# Patient Record
Sex: Male | Born: 1985 | Race: White | Hispanic: No | Marital: Married | Smoking: Never smoker
Health system: Southern US, Community
[De-identification: ages and names within clinical notes are randomized; demographics above are authoritative.]

## PROBLEM LIST (undated history)

## (undated) DIAGNOSIS — R0683 Snoring: Secondary | ICD-10-CM

## (undated) DIAGNOSIS — R519 Headache, unspecified: Secondary | ICD-10-CM

## (undated) DIAGNOSIS — R51 Headache: Secondary | ICD-10-CM

## (undated) DIAGNOSIS — G43909 Migraine, unspecified, not intractable, without status migrainosus: Secondary | ICD-10-CM

## (undated) HISTORY — DX: Snoring: R06.83

## (undated) HISTORY — PX: WISDOM TOOTH EXTRACTION: SHX21

---

## 2013-02-05 ENCOUNTER — Encounter (HOSPITAL_COMMUNITY): Payer: Self-pay | Admitting: Emergency Medicine

## 2013-02-05 ENCOUNTER — Emergency Department (HOSPITAL_COMMUNITY)
Admission: EM | Admit: 2013-02-05 | Discharge: 2013-02-05 | Disposition: A | Discharge status: Home / Self Care | Payer: BC Managed Care – PPO | Source: Home / Self Care | Attending: Family Medicine | Admitting: Family Medicine

## 2013-02-05 DIAGNOSIS — J039 Acute tonsillitis, unspecified: Secondary | ICD-10-CM

## 2013-02-05 LAB — POCT RAPID STREP A: Streptococcus, Group A Screen (Direct): NEGATIVE

## 2013-02-05 MED ORDER — AMOXICILLIN-POT CLAVULANATE 875-125 MG PO TABS: 1.0000 | ORAL_TABLET | Freq: Two times a day (BID) | ORAL | Status: DC

## 2013-02-05 NOTE — ED Notes (Signed)
C/o sore throat  States he does have some diarrhea, nasal drainage and fatigue States there is a new born in home States this morning he saw pus in his throat

## 2013-02-05 NOTE — Discharge Instructions (Signed)
Thank you for coming in today. Take Augmentin twice daily for one week. Use up to 2 Aleve twice daily for pain control. Call or go to the emergency room if you get worse, have trouble breathing, have chest pains, or palpitations.

## 2013-02-05 NOTE — ED Provider Notes (Signed)
Joel Henry is a 28 y.o. male who presents to Urgent Care today for sore throat for 3 days. Patient has tonsillar exudate. His symptoms are consistent with prior episodes of tonsillitis. He has not tried any medications. He denies any fevers chills nausea vomiting but does note some mild diarrhea. He denies any cough but does note some nasal congestion. He has a 15-day-old newborn at home.   History reviewed. No pertinent past medical history. History  Substance Use Topics  . Smoking status: Not on file  . Smokeless tobacco: Not on file  . Alcohol Use: Not on file   ROS as above Medications: No current facility-administered medications for this encounter.   Current Outpatient Prescriptions  Medication Sig Dispense Refill  . amoxicillin-clavulanate (AUGMENTIN) 875-125 MG per tablet Take 1 tablet by mouth every 12 (twelve) hours.  14 tablet  0    Exam:  BP 135/72  Pulse 68  Temp(Src) 98.7 F (37.1 C) (Oral)  Resp 14  SpO2 100% Gen: Well NAD HEENT: EOMI,  MMM right tonsillar erythematous with exudate.  Today membranes are normal appearing bilaterally. Right anterior cervical lymphadenopathy is present. Lungs: Normal work of breathing. CTABL Heart: RRR no MRG Abd: NABS, Soft. NT, ND Exts: Brisk capillary refill, warm and well perfused.   Results for orders placed during the hospital encounter of 02/05/13 (from the past 24 hour(s))  POCT RAPID STREP A ([facility] CARE ONLY)     Status: None   Collection Time    02/05/13  9:27 AM      Result Value Range   Streptococcus, Group A Screen (Direct) NEGATIVE  NEGATIVE   No results found.  Assessment and Plan: 28 y.o. male with tonsillitis. Strep culture is pending. Plan to treat with Augmentin and use over-the-counter Aleve for pain control.  Discussed warning signs or symptoms. Please see discharge instructions. Patient expresses understanding.    Rodolph BongEvan S Kindell Strada, MD 02/05/13 [phone]

## 2013-02-07 LAB — CULTURE, GROUP A STREP

## 2013-07-30 ENCOUNTER — Encounter: Payer: Self-pay | Admitting: *Deleted

## 2013-07-30 ENCOUNTER — Encounter: Payer: Self-pay | Admitting: Podiatry

## 2013-07-30 ENCOUNTER — Ambulatory Visit (INDEPENDENT_AMBULATORY_CARE_PROVIDER_SITE_OTHER): Payer: BC Managed Care – PPO | Admitting: Podiatry

## 2013-07-30 VITALS — BP 130/76 | HR 73 | Resp 16 | Ht 71.0 in | Wt 200.0 lb

## 2013-07-30 DIAGNOSIS — L608 Other nail disorders: Secondary | ICD-10-CM

## 2013-07-30 NOTE — Patient Instructions (Signed)

## 2013-07-30 NOTE — Progress Notes (Signed)
Nail specimens of the 1st left toenail sent out to [facility].

## 2013-07-30 NOTE — Progress Notes (Signed)
   Subjective:    Patient ID: Joel Henry, male    DOB: 1985-09-23, 28 y.o.   MRN: [id number]  HPI Comments: "I have this toenail problem"  Patient c/o thick, discolored toenail, 1st toe left foot, for about 1 year. [place] and had trouble with ingrowns. They removed the whole toenail at the time. The nail grew back and has been discolored and thick since. No at home treatment.     Review of Systems  All other systems reviewed and are negative.      Objective:   Physical Exam: I have reviewed his past medical history medications allergies surgeries social history and review of systems. Pulses are strongly palpable bilateral. Neurologic sensorium is intact per Semmes-Weinstein monofilament. Deep tendon reflexes are intact bilateral muscle strength is 5 over 5 dorsiflexors plantar flexors inverters everters all intrinsic musculature is intact. Orthopedic evaluation demonstrates all joints distal to the ankle a full range of motion without crepitation. Cutaneous evaluation demonstrates supple well hydrated cutis without erythema edema saline is drainage or odor. Hallux nail plate left demonstrates distal onycholysis with questionable onychomycosis. More than likely this is a nail dystrophy left hallux. I see no signs of fungal infection about the skin.        Assessment & Plan:  Assessment: Nail dystrophy hallux toenail plate left.  Plan: Discussed etiology pathology conservative versus surgical therapies. At this point a sample of the nail and skin were taken for pathologic evaluation. We will notify him once we have the results.

## 2013-11-06 ENCOUNTER — Encounter (HOSPITAL_COMMUNITY): Payer: Self-pay | Admitting: Emergency Medicine

## 2013-11-06 ENCOUNTER — Emergency Department (INDEPENDENT_AMBULATORY_CARE_PROVIDER_SITE_OTHER)
Admission: EM | Admit: 2013-11-06 | Discharge: 2013-11-06 | Disposition: A | Discharge status: Home / Self Care | Payer: BC Managed Care – PPO | Source: Home / Self Care | Attending: Family Medicine | Admitting: Family Medicine

## 2013-11-06 DIAGNOSIS — L237 Allergic contact dermatitis due to plants, except food: Secondary | ICD-10-CM

## 2013-11-06 MED ORDER — TRIAMCINOLONE ACETONIDE 40 MG/ML IJ SUSP
INTRAMUSCULAR | Status: AC
  Filled 2013-11-06: qty 1

## 2013-11-06 MED ORDER — METHYLPREDNISOLONE ACETATE 80 MG/ML IJ SUSP
INTRAMUSCULAR | Status: AC
  Filled 2013-11-06: qty 1

## 2013-11-06 MED ORDER — TRIAMCINOLONE ACETONIDE 40 MG/ML IJ SUSP
40.0000 mg | Freq: Once | INTRAMUSCULAR | Status: AC
  Administered 2013-11-06: 40 mg via INTRAMUSCULAR

## 2013-11-06 MED ORDER — FLUTICASONE PROPIONATE 0.05 % EX CREA: TOPICAL_CREAM | Freq: Two times a day (BID) | CUTANEOUS | Status: DC

## 2013-11-06 MED ORDER — METHYLPREDNISOLONE ACETATE 40 MG/ML IJ SUSP
80.0000 mg | Freq: Once | INTRAMUSCULAR | Status: AC
  Administered 2013-11-06: 80 mg via INTRAMUSCULAR

## 2013-11-06 NOTE — ED Notes (Signed)
Rash since yesterday, ?poison ivy ??

## 2013-11-06 NOTE — ED Provider Notes (Signed)
CSN: [id number]     Arrival date & time 11/06/13  0847 History   First MD Initiated Contact with Patient 11/06/13 [phone]     No chief complaint on file.  (Consider location/radiation/quality/duration/timing/severity/associated sxs/prior Treatment) Patient is a 28 y.o. male presenting with rash. The history is provided by the patient.  Rash Location:  Head/neck and shoulder/arm Head/neck rash location:  R neck Shoulder/arm rash location:  R forearm Quality: blistering, draining, itchiness and redness   Severity:  Mild Onset quality:  Gradual Duration:  1 day Progression:  Spreading Chronicity:  New Context: plant contact     No past medical history on file. No past surgical history on file. No family history on file. History  Substance Use Topics  . Smoking status: Never Smoker   . Smokeless tobacco: Not on file  . Alcohol Use: Not on file    Review of Systems  Skin: Positive for rash.    Allergies  Review of patient's allergies indicates no known allergies.  Home Medications   Prior to Admission medications   Medication Sig Start Date End Date Taking? Authorizing Provider  fluticasone (CUTIVATE) 0.05 % cream Apply topically 2 (two) times daily. 11/06/13   Linna HoffJames D Kindl, MD  OVER THE COUNTER MEDICATION Allergy meds    Historical Provider, MD   BP 140/75  Pulse 78  Temp(Src) 97.9 F (36.6 C) (Oral)  Resp 16  SpO2 98% Physical Exam  Nursing note and vitals reviewed. Constitutional: He is oriented to person, place, and time. He appears well-developed and well-nourished.  Neurological: He is alert and oriented to person, place, and time.  Skin: Skin is warm and dry. Rash noted.  Weeping papulovesicular patchy rash on neck and r forearm.    ED Course  Procedures (including critical care time) Labs Review Labs Reviewed - No data to display  Imaging Review No results found.   MDM   1. Contact dermatitis due to poison ivy        Linna HoffJames D Kindl,  MD 11/06/13 [phone]

## 2013-12-13 ENCOUNTER — Encounter (HOSPITAL_COMMUNITY): Payer: Self-pay | Admitting: Emergency Medicine

## 2013-12-13 ENCOUNTER — Emergency Department (INDEPENDENT_AMBULATORY_CARE_PROVIDER_SITE_OTHER)
Admission: EM | Admit: 2013-12-13 | Discharge: 2013-12-13 | Disposition: A | Discharge status: Home / Self Care | Payer: BC Managed Care – PPO | Source: Home / Self Care | Attending: Family Medicine | Admitting: Family Medicine

## 2013-12-13 DIAGNOSIS — R51 Headache: Secondary | ICD-10-CM

## 2013-12-13 DIAGNOSIS — R519 Headache, unspecified: Secondary | ICD-10-CM

## 2013-12-13 DIAGNOSIS — R251 Tremor, unspecified: Secondary | ICD-10-CM

## 2013-12-13 LAB — POCT I-STAT, CHEM 8
BUN: 12 mg/dL (ref 6–23)
CREATININE: 0.9 mg/dL (ref 0.50–1.35)
Calcium, Ion: 1.22 mmol/L (ref 1.12–1.23)
Chloride: 102 mEq/L (ref 96–112)
Glucose, Bld: 96 mg/dL (ref 70–99)
HCT: 52 % (ref 39.0–52.0)
Hemoglobin: 17.7 g/dL — ABNORMAL HIGH (ref 13.0–17.0)
POTASSIUM: 3.9 meq/L (ref 3.7–5.3)
SODIUM: 140 meq/L (ref 137–147)
TCO2: 27 mmol/L (ref 0–100)

## 2013-12-13 MED ORDER — KETOROLAC TROMETHAMINE 60 MG/2ML IM SOLN
INTRAMUSCULAR | Status: AC
  Filled 2013-12-13: qty 2

## 2013-12-13 MED ORDER — METOCLOPRAMIDE HCL 5 MG/ML IJ SOLN
5.0000 mg | Freq: Once | INTRAMUSCULAR | Status: AC
  Administered 2013-12-13: 5 mg via INTRAMUSCULAR

## 2013-12-13 MED ORDER — DEXAMETHASONE SODIUM PHOSPHATE 10 MG/ML IJ SOLN
10.0000 mg | Freq: Once | INTRAMUSCULAR | Status: AC
  Administered 2013-12-13: 10 mg via INTRAMUSCULAR

## 2013-12-13 MED ORDER — METOCLOPRAMIDE HCL 5 MG/ML IJ SOLN
INTRAMUSCULAR | Status: AC
  Filled 2013-12-13: qty 2

## 2013-12-13 MED ORDER — DEXAMETHASONE SODIUM PHOSPHATE 10 MG/ML IJ SOLN
INTRAMUSCULAR | Status: AC
  Filled 2013-12-13: qty 1

## 2013-12-13 MED ORDER — KETOROLAC TROMETHAMINE 60 MG/2ML IM SOLN
60.0000 mg | Freq: Once | INTRAMUSCULAR | Status: AC
  Administered 2013-12-13: 60 mg via INTRAMUSCULAR

## 2013-12-13 MED ORDER — DICLOFENAC SODIUM 75 MG PO TBEC: 75.0000 mg | DELAYED_RELEASE_TABLET | Freq: Two times a day (BID) | ORAL | Status: DC | PRN

## 2013-12-13 NOTE — Discharge Instructions (Signed)
Thank you for coming in today. Take diclofenac twice daily as needed for pain starting about 6 PM today. Follow-up with your primary care provider soon. Go to the emergency room if you get worse  Go to the emergency room if your headache becomes excruciating or you have weakness or numbness or uncontrolled vomiting.   Migraine Headache A migraine headache is an intense, throbbing pain on one or both sides of your head. A migraine can last for 30 minutes to several hours. CAUSES  The exact cause of a migraine headache is not always known. However, a migraine may be caused when nerves in the brain become irritated and release chemicals that cause inflammation. This causes pain. Certain things may also trigger migraines, such as:  Alcohol.  Smoking.  Stress.  Menstruation.  Aged cheeses.  Foods or drinks that contain nitrates, glutamate, aspartame, or tyramine.  Lack of sleep.  Chocolate.  Caffeine.  Hunger.  Physical exertion.  Fatigue.  Medicines used to treat chest pain (nitroglycerine), birth control pills, estrogen, and some blood pressure medicines. SIGNS AND SYMPTOMS  Pain on one or both sides of your head.  Pulsating or throbbing pain.  Severe pain that prevents daily activities.  Pain that is aggravated by any physical activity.  Nausea, vomiting, or both.  Dizziness.  Pain with exposure to bright lights, loud noises, or activity.  General sensitivity to bright lights, loud noises, or smells. Before you get a migraine, you may get warning signs that a migraine is coming (aura). An aura may include:  Seeing flashing lights.  Seeing bright spots, halos, or zigzag lines.  Having tunnel vision or blurred vision.  Having feelings of numbness or tingling.  Having trouble talking.  Having muscle weakness. DIAGNOSIS  A migraine headache is often diagnosed based on:  Symptoms.  Physical exam.  A CT scan or MRI of your head. These imaging tests  cannot diagnose migraines, but they can help rule out other causes of headaches. TREATMENT Medicines may be given for pain and nausea. Medicines can also be given to help prevent recurrent migraines.  HOME CARE INSTRUCTIONS  Only take over-the-counter or prescription medicines for pain or discomfort as directed by your health care provider. The use of long-term narcotics is not recommended.  Lie down in a dark, quiet room when you have a migraine.  Keep a journal to find out what may trigger your migraine headaches. For example, write down:  What you eat and drink.  How much sleep you get.  Any change to your diet or medicines.  Limit alcohol consumption.  Quit smoking if you smoke.  Get 7-9 hours of sleep, or as recommended by your health care provider.  Limit stress.  Keep lights dim if bright lights bother you and make your migraines worse. SEEK IMMEDIATE MEDICAL CARE IF:   Your migraine becomes severe.  You have a fever.  You have a stiff neck.  You have vision loss.  You have muscular weakness or loss of muscle control.  You start losing your balance or have trouble walking.  You feel faint or pass out.  You have severe symptoms that are different from your first symptoms. MAKE SURE YOU:   Understand these instructions.  Will watch your condition.  Will get help right away if you are not doing well or get worse. Document Released: 01/08/2005 Document Revised: 05/25/2013 Document Reviewed: 09/15/2012 [facility] Patient Information 2015 [place] [place]. This information is not intended to replace advice given to you by  your health care provider. Make sure you discuss any questions you have with your health care provider. ° °

## 2013-12-13 NOTE — ED Provider Notes (Addendum)
Joel Henry is a 28 y.o. male who presents to Urgent Care today for headache. Patient is at a moderate persistent headache for about a week. This is associated with some fatigue. He also notes mild nausea. He denies any significant vision changes. No difficulty with balance. He notes a mild tremor in his left hand starting about a day ago. He is left-handed. He feels well otherwise. The headache was intermittent initially but has become persistent. The headache is gradual onset. No history of migraines. He has tried over-the-counter medications which helped only a little.   History reviewed. No pertinent past medical history. History reviewed. No pertinent past surgical history. History  Substance Use Topics  . Smoking status: Never Smoker   . Smokeless tobacco: Not on file  . Alcohol Use: No   ROS as above Medications: No current facility-administered medications for this encounter.   Current Outpatient Prescriptions  Medication Sig Dispense Refill  . diclofenac (VOLTAREN) 75 MG EC tablet Take 1 tablet (75 mg total) by mouth 2 (two) times daily as needed. 30 tablet 0  . fluticasone (CUTIVATE) 0.05 % cream Apply topically 2 (two) times daily. 60 g 0  . OVER THE COUNTER MEDICATION Allergy meds     No Known Allergies   Exam:  BP 138/86 mmHg  Pulse 77  Temp(Src) 97.3 F (36.3 C) (Oral)  Resp 16  SpO2 97% Gen: Well NAD HEENT: EOMI,  MMM PERRL Lungs: Normal work of breathing. CTABL Heart: RRR no MRG Abd: NABS, Soft. Nondistended, Nontender Exts: Brisk capillary refill, warm and well perfused.  Neuro: Alert and oriented cranial nerves II through XII are intact normal coordination balance sensation strength. Finger-nose-finger reveals no ataxia. Patient has a very faint fine tremor of his left index finger. The tremor is not present at rest. His neuro exam is otherwise normal.  Visual Acuity:  Right Eye Distance: 20/15   Left Eye Distance: 20/15 Bilateral  Distance: 20/15   Patient was given intramuscular injections as listed above.  Results for orders placed or performed during the hospital encounter of 12/13/13 (from the past 24 hour(s))  I-STAT, chem 8     Status: Abnormal   Collection Time: 12/13/13 11:51 AM  Result Value Ref Range   Sodium 140 137 - 147 mEq/L   Potassium 3.9 3.7 - 5.3 mEq/L   Chloride 102 96 - 112 mEq/L   BUN 12 6 - 23 mg/dL   Creatinine, Ser 3.080.90 0.50 - 1.35 mg/dL   Glucose, Bld 96 70 - 99 mg/dL   Calcium, Ion 6.571.22 8.461.12 - 1.23 mmol/L   TCO2 27 0 - 100 mmol/L   Hemoglobin 17.7 (H) 13.0 - 17.0 g/dL   HCT 96.252.0 95.239.0 - 84.152.0 %   No results found.  Assessment and Plan: 28 y.o. male with headache. Likely migraine. Treatment with medications as above. Continue NSAIDs. Follow-up with PCP. Present to the emergency room if worse. Tremor I believe is related to headache. Recommend patient follow-up with PCP.  Discussed warning signs or symptoms. Please see discharge instructions. Patient expresses understanding.     Rodolph BongEvan S Shivam Mestas, MD 12/13/13 1205   ketorolac (TORADOL) IM injection 60 mg ; metoCLOPramide (REGLAN) IM injection 5 mg ; dexamethasone IM (DECADRON) injection 10 mg   given  Rodolph BongEvan S Thoren Hosang, MD 12/19/13 [phone]

## 2013-12-13 NOTE — ED Notes (Signed)
C/o headache x 1 wk.   States the last 2 to 3 days headache has been constant and having no relief with tylenol.  States fuzzy vision with standing and having mild nausea.  No hx of migraines.

## 2013-12-15 ENCOUNTER — Emergency Department (HOSPITAL_COMMUNITY): Payer: BC Managed Care – PPO

## 2013-12-15 ENCOUNTER — Encounter (HOSPITAL_COMMUNITY): Payer: Self-pay | Admitting: Emergency Medicine

## 2013-12-15 ENCOUNTER — Observation Stay (HOSPITAL_COMMUNITY)
Admission: EM | Admit: 2013-12-15 | Discharge: 2013-12-16 | Disposition: A | Discharge status: Home / Self Care | Payer: BC Managed Care – PPO | Attending: Internal Medicine | Admitting: Internal Medicine

## 2013-12-15 ENCOUNTER — Observation Stay (HOSPITAL_COMMUNITY): Payer: BC Managed Care – PPO

## 2013-12-15 DIAGNOSIS — R4701 Aphasia: Secondary | ICD-10-CM | POA: Diagnosis present

## 2013-12-15 DIAGNOSIS — G43909 Migraine, unspecified, not intractable, without status migrainosus: Secondary | ICD-10-CM | POA: Insufficient documentation

## 2013-12-15 DIAGNOSIS — G459 Transient cerebral ischemic attack, unspecified: Principal | ICD-10-CM | POA: Insufficient documentation

## 2013-12-15 DIAGNOSIS — G43109 Migraine with aura, not intractable, without status migrainosus: Secondary | ICD-10-CM | POA: Diagnosis present

## 2013-12-15 DIAGNOSIS — M25559 Pain in unspecified hip: Secondary | ICD-10-CM | POA: Insufficient documentation

## 2013-12-15 DIAGNOSIS — M25551 Pain in right hip: Secondary | ICD-10-CM

## 2013-12-15 DIAGNOSIS — I639 Cerebral infarction, unspecified: Secondary | ICD-10-CM

## 2013-12-15 DIAGNOSIS — G43709 Chronic migraine without aura, not intractable, without status migrainosus: Secondary | ICD-10-CM

## 2013-12-15 HISTORY — DX: Headache: R51

## 2013-12-15 HISTORY — DX: Migraine, unspecified, not intractable, without status migrainosus: G43.909

## 2013-12-15 HISTORY — DX: Headache, unspecified: R51.9

## 2013-12-15 LAB — DIFFERENTIAL
Basophils Absolute: 0 10*3/uL (ref 0.0–0.1)
Basophils Relative: 0 % (ref 0–1)
EOS ABS: 0.1 10*3/uL (ref 0.0–0.7)
Eosinophils Relative: 2 % (ref 0–5)
LYMPHS ABS: 3.6 10*3/uL (ref 0.7–4.0)
Lymphocytes Relative: 54 % — ABNORMAL HIGH (ref 12–46)
MONO ABS: 0.7 10*3/uL (ref 0.1–1.0)
Monocytes Relative: 10 % (ref 3–12)
Neutro Abs: 2.2 10*3/uL (ref 1.7–7.7)
Neutrophils Relative %: 34 % — ABNORMAL LOW (ref 43–77)

## 2013-12-15 LAB — CBC
HCT: 41.6 % (ref 39.0–52.0)
HEMATOCRIT: 44.4 % (ref 39.0–52.0)
Hemoglobin: 15.5 g/dL (ref 13.0–17.0)
Hemoglobin: 14.8 g/dL (ref 13.0–17.0)
MCH: 32.6 pg (ref 26.0–34.0)
MCH: 32.2 pg (ref 26.0–34.0)
MCHC: 34.9 g/dL (ref 30.0–36.0)
MCHC: 35.6 g/dL (ref 30.0–36.0)
MCV: 93.3 fL (ref 78.0–100.0)
MCV: 90.4 fL (ref 78.0–100.0)
PLATELETS: 165 10*3/uL (ref 150–400)
PLATELETS: 164 10*3/uL (ref 150–400)
RBC: 4.76 MIL/uL (ref 4.22–5.81)
RBC: 4.6 MIL/uL (ref 4.22–5.81)
RDW: 11.7 % (ref 11.5–15.5)
RDW: 11.6 % (ref 11.5–15.5)
WBC: 6.5 10*3/uL (ref 4.0–10.5)
WBC: 6.4 10*3/uL (ref 4.0–10.5)

## 2013-12-15 LAB — PROTIME-INR
INR: 1.12 (ref 0.00–1.49)
PROTHROMBIN TIME: 14.5 s (ref 11.6–15.2)

## 2013-12-15 LAB — I-STAT CHEM 8, ED
BUN: 15 mg/dL (ref 6–23)
CREATININE: 0.8 mg/dL (ref 0.50–1.35)
Calcium, Ion: 1.16 mmol/L (ref 1.12–1.23)
Chloride: 101 mEq/L (ref 96–112)
Glucose, Bld: 107 mg/dL — ABNORMAL HIGH (ref 70–99)
HCT: 45 % (ref 39.0–52.0)
HEMOGLOBIN: 15.3 g/dL (ref 13.0–17.0)
POTASSIUM: 3.7 meq/L (ref 3.7–5.3)
Sodium: 141 mEq/L (ref 137–147)
TCO2: 28 mmol/L (ref 0–100)

## 2013-12-15 LAB — COMPREHENSIVE METABOLIC PANEL
ALT: 13 U/L (ref 0–53)
ANION GAP: 14 (ref 5–15)
AST: 16 U/L (ref 0–37)
Albumin: 4.3 g/dL (ref 3.5–5.2)
Alkaline Phosphatase: 61 U/L (ref 39–117)
BUN: 13 mg/dL (ref 6–23)
CALCIUM: 9 mg/dL (ref 8.4–10.5)
CO2: 24 mEq/L (ref 19–32)
CREATININE: 0.76 mg/dL (ref 0.50–1.35)
Chloride: 103 mEq/L (ref 96–112)
GLUCOSE: 104 mg/dL — AB (ref 70–99)
Potassium: 3.8 mEq/L (ref 3.7–5.3)
SODIUM: 141 meq/L (ref 137–147)
Total Bilirubin: 0.5 mg/dL (ref 0.3–1.2)
Total Protein: 7.2 g/dL (ref 6.0–8.3)

## 2013-12-15 LAB — CBG MONITORING, ED: Glucose-Capillary: 99 mg/dL (ref 70–99)

## 2013-12-15 LAB — I-STAT TROPONIN, ED: Troponin i, poc: 0 ng/mL (ref 0.00–0.08)

## 2013-12-15 LAB — CREATININE, SERUM: Creatinine, Ser: 0.76 mg/dL (ref 0.50–1.35)

## 2013-12-15 LAB — ETHANOL

## 2013-12-15 LAB — APTT: aPTT: 29 seconds (ref 24–37)

## 2013-12-15 MED ORDER — ONDANSETRON HCL 4 MG/2ML IJ SOLN: 4.0000 mg | Freq: Four times a day (QID) | INTRAMUSCULAR | Status: DC | PRN

## 2013-12-15 MED ORDER — FLUTICASONE PROPIONATE 0.05 % EX CREA: TOPICAL_CREAM | Freq: Two times a day (BID) | CUTANEOUS | Status: DC

## 2013-12-15 MED ORDER — ENOXAPARIN SODIUM 40 MG/0.4ML ~~LOC~~ SOLN
40.0000 mg | SUBCUTANEOUS | Status: DC
  Administered 2013-12-15: 40 mg via SUBCUTANEOUS
  Filled 2013-12-15: qty 0.4

## 2013-12-15 MED ORDER — STROKE: EARLY STAGES OF RECOVERY BOOK
Freq: Once | Status: AC
  Administered 2013-12-15: 21:00:00
  Filled 2013-12-15: qty 1

## 2013-12-15 MED ORDER — KETOROLAC TROMETHAMINE 30 MG/ML IJ SOLN
30.0000 mg | Freq: Once | INTRAMUSCULAR | Status: AC
  Administered 2013-12-15: 30 mg via INTRAVENOUS
  Filled 2013-12-15: qty 1

## 2013-12-15 MED ORDER — PROMETHAZINE HCL 25 MG/ML IJ SOLN
25.0000 mg | Freq: Four times a day (QID) | INTRAMUSCULAR | Status: DC | PRN
  Administered 2013-12-15: 25 mg via INTRAVENOUS
  Filled 2013-12-15: qty 1

## 2013-12-15 MED ORDER — ONDANSETRON HCL 4 MG/2ML IJ SOLN
INTRAMUSCULAR | Status: AC
  Filled 2013-12-15: qty 4

## 2013-12-15 MED ORDER — ASPIRIN 81 MG PO CHEW
81.0000 mg | CHEWABLE_TABLET | Freq: Every day | ORAL | Status: DC
  Administered 2013-12-15 – 2013-12-16 (×2): 81 mg via ORAL
  Filled 2013-12-15 (×2): qty 1

## 2013-12-15 MED ORDER — DIPHENHYDRAMINE HCL 50 MG/ML IJ SOLN
25.0000 mg | Freq: Four times a day (QID) | INTRAMUSCULAR | Status: DC | PRN
  Administered 2013-12-15: 25 mg via INTRAVENOUS
  Filled 2013-12-15: qty 1

## 2013-12-15 NOTE — ED Notes (Addendum)
Patient transported to MRI with stroke nurse, neurologist, and rapid response nurse.  Family at bedside, updated after patient left.

## 2013-12-15 NOTE — ED Provider Notes (Signed)
CSN: [id number]     Arrival date & time 12/15/13  1249 History   First MD Initiated Contact with Patient 12/15/13 1251     Chief Complaint  Patient presents with  . Code Stroke     (Consider location/radiation/quality/duration/timing/severity/associated sxs/prior Treatment) HPI  This is a 28 y/o male brought in by his coworker for sudden onset confusion. History is given by the coworker as the patient is unable to give history. Coworkers states that his last seen normal was about a half hour before arrival to the emergency department. He states that the patient was coming back from the cafeteria. Coworker was working on his computer when he heard a loud thump. He asked if the patient fell. Patient reported he was okay. He heard the patient pick up his phone after draining and had a conversation on the phone that lasted approximately 2 minutes, which he states was completely unintelligible. He went over to check on him and the patient stated that he did not feel well. Coworker states patient's speech was unintelligible. He appeared confused. He did not notice any other focal neurologic deficits such as facial paralysis, or flattening, weakness, disturbance of gait. The patient was otherwise able to follow commands. No known history of migraine headaches. Review of medical records shows that the patient was seen at: Urgent care 2 days ago with a complaint of persistent headache, which lasted approximately 1 week. He also complained of a tremor in the left hand. Note from the doctor Denyse AmassCorey at the urgent care, states that he felt patient's headache was consistent with a migraine.  History reviewed. No pertinent past medical history. History reviewed. No pertinent past surgical history. History reviewed. No pertinent family history. History  Substance Use Topics  . Smoking status: Never Smoker   . Smokeless tobacco: Not on file  . Alcohol Use: No    Review of Systems  Unable to perform  ROS     Allergies  Review of patient's allergies indicates no known allergies.  Home Medications   Prior to Admission medications   Medication Sig Start Date End Date Taking? Authorizing Provider  diclofenac (VOLTAREN) 75 MG EC tablet Take 1 tablet (75 mg total) by mouth 2 (two) times daily as needed. 12/13/13   Rodolph BongEvan S Corey, MD  fluticasone (CUTIVATE) 0.05 % cream Apply topically 2 (two) times daily. 11/06/13   Linna HoffJames D Kindl, MD  OVER THE COUNTER MEDICATION Allergy meds    Historical Provider, MD   BP 138/62 mmHg  Pulse 71  Temp(Src) 97.3 F (36.3 C)  Resp 11  SpO2 100% Physical Exam  Constitutional: He appears well-developed and well-nourished. No distress.  HENT:  Head: Normocephalic and atraumatic.  Eyes: Conjunctivae are normal. No scleral icterus.  Neck: Normal range of motion. Neck supple.  Cardiovascular: Normal rate, regular rhythm and normal heart sounds.   Pulmonary/Chest: Effort normal and breath sounds normal. No respiratory distress.  Abdominal: Soft. There is no tenderness.  Musculoskeletal: He exhibits no edema.  Neurological: He is alert.  Speech is clear  follows commands Major Cranial nerves without deficit, no facial droop Normal strength in upper and lower extremities bilaterally including dorsiflexion and plantar flexion, strong and equal grip strength Sensation normal to light and sharp touch Moves extremities without ataxia, coordination intact Normal finger to nose and rapid alternating movements Neg romberg, no pronator drift Normal gait Normal heel-shin and balance   Skin: Skin is warm and dry. He is not diaphoretic.  Psychiatric: His behavior is  normal.  Nursing note and vitals reviewed.   ED Course  Procedures (including critical care time) Labs Review Labs Reviewed  I-STAT CHEM 8, ED - Abnormal; Notable for the following:    Glucose, Bld 107 (*)    All other components within normal limits  ETHANOL  PROTIME-INR  APTT  CBC   DIFFERENTIAL  COMPREHENSIVE METABOLIC PANEL  URINE RAPID DRUG SCREEN (HOSP PERFORMED)  URINALYSIS, ROUTINE W REFLEX MICROSCOPIC  I-STAT TROPOININ, ED  I-STAT TROPOININ, ED  CBG MONITORING, ED    Imaging Review Ct Head Wo Contrast  12/15/2013   CLINICAL DATA:  Code stroke, expressive aphasia.  EXAM: CT HEAD WITHOUT CONTRAST  TECHNIQUE: Contiguous axial images were obtained from the base of the skull through the vertex without intravenous contrast.  COMPARISON:  None.  FINDINGS: No evidence of an acute infarct, acute hemorrhage, mass lesion, mass effect or hydrocephalus. Visualized portions of the paranasal sinuses and mastoid air cells are clear.  IMPRESSION: Negative. These results were called by telephone at the time of interpretation on 12/15/2013 at 1:12 pm to Dr. Leroy KennedyAMILO, who verbally acknowledged these results.   Electronically Signed   By: Leanna BattlesMelinda  Blietz M.D.   On: 12/15/2013 13:12     EKG Interpretation None      MDM   Final diagnoses:  Hip pain, right    3:04 PM BP 133/61 mmHg  Pulse 71  Temp(Src) 98.1 F (36.7 C)  Resp 14  SpO2 99% Patient with negative work up today. I have spoken with Dr Cyril Mourningamillo who asks that the patient be admitted for a TIA work up. His expressive aphasia has apparently resolved. PCP: Dr. Darcus AustinPerrini   Patient ct a nd MRI without any evidence of acute stroke or other abnormality. ? TIA vx. Complicated migraine.  Patient iwill be admitted for TIA work up.   Arthor CaptainAbigail Carollyn Etcheverry, PA-C 12/20/13 [phone]

## 2013-12-15 NOTE — ED Notes (Signed)
Ordered heart healthy tray  

## 2013-12-15 NOTE — ED Notes (Signed)
Pt was  At work and came back from getting lunch; heard bang as he was walking down hallway; gait was unsteady. Sat down and got a phone call and was talking to them and co worker could tell something was wrong with his speech. Pt stated "he did not feel right".

## 2013-12-15 NOTE — Progress Notes (Signed)
Pt arrived to unit per stretcher from ED with RN. No acute distress noted. Assessment performed as charted.   Andrew AuVafiadis, Riordan Walle I 12/15/2013 4:14 PM

## 2013-12-15 NOTE — Code Documentation (Signed)
28[facility].  Patient with c/o expressive aphasia.  Patient was at work when he went to lunch and returned, made a phone call and coworker noticed patient's speech to be incomprehensible.  Patient brought to the ED. Code stroke activated.  Patient taken to CT.  Stroke team to the bedside.  NIHSS 2, see documentation for details and code stroke times.  Patient with expressive aphasia.  Patient is unable to name objects or describe scene on NIHSS cards.  Patient is however to name physical objects such as RN's pen, key and phone.  Patient clearly reads words and phrases on NIHSS cards.  Patient continues to have difficulty expressing himself and answering questions.  Patient to MRI at 1315 with Stroke RN and RRT.  MRI DWI negative per Neuro PA.  Patient to complete MRI/MRA studies.  Handoff with RRT RN. Patient back to baseline s/p MRI per RRT RN.  Patient to be admitted.

## 2013-12-15 NOTE — Progress Notes (Signed)
Called rapid response. Hella, RN stated to call Dr. Cyril Mourningamillo.  Dr. Cyril Mourningamillo notified and informed of pt's condition, expressive aphasia. Pt vomited on MD's arrival. MD entered orders.  Will continue to closely monitor pt. Wife and child at bedside.   Andrew AuVafiadis, Ena Demary I 12/15/2013 6:34 PM

## 2013-12-15 NOTE — ED Provider Notes (Signed)
Patient presents to the emergency room with an acute change in his ability to speak. Patient was at work today. Apparently he came back from lunch. The symptoms all started at noon.  Coworkers noted that he was having difficulty speaking. Patient was able tell him at that point that he didn't feel right. Here in the emergency department the patient is having significant difficulty speaking. He is only able to say a few words. Not able to provide any history Physical Exam   BP 138/62 mmHg  Pulse 71  Temp(Src) 97.3 F (36.3 C)  Resp 11  SpO2 100%  Physical Exam  Constitutional: He appears well-developed and well-nourished. No distress.  HENT:  Head: Normocephalic and atraumatic.  Right Ear: External ear normal.  Left Ear: External ear normal.  Eyes: Conjunctivae are normal. Right eye exhibits no discharge. Left eye exhibits no discharge. No scleral icterus.  Neck: Neck supple. No tracheal deviation present.  Cardiovascular: Normal rate.   Pulmonary/Chest: Effort normal. No stridor. No respiratory distress.  Musculoskeletal: He exhibits no edema.  Neurological: He is alert. No cranial nerve deficit (no gross deficits). GCS eye subscore is 4. GCS verbal subscore is 4. GCS motor subscore is 6.  Patient is able to hold both hands up against gravity without drift, he is also able to hold both legs up off the bed, patient has a significant aphasia, he is barely able to say more than 1 or 2 words. Patient has repetitive speech  Skin: Skin is warm and dry. No rash noted.  Psychiatric: He has a normal mood and affect.  Nursing note and vitals reviewed.   ED Course  Procedures  MDM Patient's symptoms are concerning for the possibility of an acute stroke. Medical records indicate that he was seen 2 days ago and was treated for a persistent headache. It is possible the symptoms could be related to a complex migraine as well.   Code stroke was called.  Neurology will be evaluating the  patient.   Linwood DibblesJon Parrish Bonn, MD 12/15/13 1318

## 2013-12-15 NOTE — Progress Notes (Signed)
Neuro and vitals obtained. NIH performed with score of 6 as charted. pt has severe aphasia. NIH of 0 at 1600. pt's current NIH 6, difficulty with expression of thoughts, words. Nurse paged MD, Dr. Thedore MinsSingh. Md ordred to call stroke team and order EEG. Nurse read back and verified.  Will monitor.   Andrew AuVafiadis, Nicolina Hirt I 12/15/2013 5:48 PM

## 2013-12-15 NOTE — ED Notes (Signed)
Pt  LSN 20 mins ago; aphasic; confused. Family here.

## 2013-12-15 NOTE — ED Notes (Signed)
A. Harris at bedside 

## 2013-12-15 NOTE — ED Notes (Signed)
Wallet given to co worker at bedside.

## 2013-12-15 NOTE — Progress Notes (Signed)
Report recd from Viviann SpareSteven, [place] in ED.   Andrew AuVafiadis, Esaiah Wanless I 12/15/2013 3:51 PM

## 2013-12-15 NOTE — ED Notes (Signed)
Patient returned from MRI, placed back on monitor.  Theodoro Gristave, Neurology, at bedside speaking with family and patient.

## 2013-12-15 NOTE — Consult Note (Signed)
Referring Physician: Lynelle Doctor    Chief Complaint: Code Stroke  HPI:                                                                                                                                         Joel Henry is an 28 y.o. male Who was noted by staff at work to have slurred speech while on the phone at work.  EMS was called and patient was brought to [facility] as a code stroke.  On arrival he was noted repeat himself, at time able to name objects and able to read sentences.  At other times unable to name objects but able to read sentences with no problem.  When asked questions he would repeat "a few weeks ago, a few weeks ago".  He was brought to CT which showed no abnormality.  Due to concern of possible stroke he was brought to MRI. MRI was reviewed by radiologist and showed no acute stroke.  Denies vertigo, double vision, focal weakness or numbness, confusion, or vision  Impairment.  In further discussion his wife notes he has had a HA for the past week and he has been under a lot of stress.   Date last known well: Date: 12/15/2013 Time last known well: Time: 12:35 tPA Given: No: MRI negative and reviewed by Radiology  Past Medical History  Diagnosis Date  . HA (headache)     History reviewed. No pertinent past surgical history.  Family History  Problem Relation Age of Onset  . Hypertension Mother   . Hypertension Father    Social History:  reports that he has never smoked. He does not have any smokeless tobacco history on file. He reports that he does not drink alcohol or use illicit drugs.  Allergies: No Known Allergies  Medications:                                                                                                                           No current facility-administered medications for this encounter.   Current Outpatient Prescriptions  Medication Sig Dispense Refill  . diclofenac (VOLTAREN) 75 MG EC tablet Take 1 tablet (75 mg total) by mouth 2 (two)  times daily as needed. 30 tablet 0  . fluticasone (CUTIVATE) 0.05 % cream Apply topically 2 (two) times daily. (Patient not taking: Reported on 12/15/2013) 60 g 0  ROS:                                                                                                                                       History obtained from wife  General ROS: negative for - chills, fatigue, fever, night sweats, weight gain or weight loss Psychological ROS: negative for - behavioral disorder, hallucinations, memory difficulties, mood swings or suicidal ideation Ophthalmic ROS: negative for - blurry vision, double vision, eye pain or loss of vision ENT ROS: negative for - epistaxis, nasal discharge, oral lesions, sore throat, tinnitus or vertigo Allergy and Immunology ROS: negative for - hives or itchy/watery eyes Hematological and Lymphatic ROS: negative for - bleeding problems, bruising or swollen lymph nodes Endocrine ROS: negative for - galactorrhea, hair pattern changes, polydipsia/polyuria or temperature intolerance Respiratory ROS: negative for - cough, hemoptysis, shortness of breath or wheezing Cardiovascular ROS: negative for - chest pain, dyspnea on exertion, edema or irregular heartbeat Gastrointestinal ROS: negative for - abdominal pain, diarrhea, hematemesis, nausea/vomiting or stool incontinence Genito-Urinary ROS: negative for - dysuria, hematuria, incontinence or urinary frequency/urgency Musculoskeletal ROS: negative for - joint swelling or muscular weakness Neurological ROS: as noted in HPI Dermatological ROS: negative for rash and skin lesion changes   Physical Exam: Blood pressure 138/62, pulse 72, temperature 97.3 F (36.3 C), resp. rate 20, SpO2 100 %. Constitutional: He appears well-developed and well-nourished.   Psych: Affect appropriate to situation Eyes: No scleral injection HENT: No OP obstrucion Head: Normocephalic.  Cardiovascular: Normal rate and regular rhythm.   Respiratory: Effort normal and breath sounds normal.  GI: Soft. Bowel sounds are normal. No distension. There is no tenderness.  Skin: WDI  Neuro Exam: Mental Status: Alert, able to follow verbal commands, at times able to name objects, able to read written sentences.  Speech fluent with evidence of aphasia and intermittent anomia.  Able to follow 3 step commands without difficulty. Cranial Nerves: II: Discs flat bilaterally; Visual fields grossly normal, pupils equal, round, reactive to light and accommodation III,IV, VI: ptosis not present, extra-ocular motions intact bilaterally V,VII: smile symmetric, facial light touch sensation normal bilaterally VIII: hearing normal bilaterally IX,X: gag reflex present XI: bilateral shoulder shrug XII: midline tongue extension without atrophy or fasciculations  Motor: Right : Upper extremity   5/5    Left:     Upper extremity   5/5  Lower extremity   5/5     Lower extremity   5/5 Tone and bulk:normal tone throughout; no atrophy noted Sensory: Pinprick and light touch intact throughout, bilaterally Deep Tendon Reflexes:  Right: Upper Extremity   Left: Upper extremity   biceps (C-5 to C-6) 2/4   biceps (C-5 to C-6) 2/4 tricep (C7) 2/4    triceps (C7) 2/4 Brachioradialis (C6) 2/4  Brachioradialis (C6) 2/4  Lower Extremity Lower Extremity  quadriceps (L-2 to L-4) 2/4   quadriceps (L-2 to L-4) 2/4 Achilles (S1)  2/4   Achilles (S1) 2/4  Plantars: Right: downgoing   Left: downgoing Cerebellar: normal finger-to-nose,  normal heel-to-shin test Gait: not tested CV: pulses palpable throughout    Lab Results: Basic Metabolic Panel:  Recent Labs Lab 12/13/13 1151 12/15/13 1305 12/15/13 1316  NA 140 141 141  K 3.9 3.8 3.7  CL 102 103 101  CO2  --  24  --   GLUCOSE 96 104* 107*  BUN 12 13 15   CREATININE 0.90 0.76 0.80  CALCIUM  --  9.0  --     Liver Function Tests:  Recent Labs Lab 12/15/13 1305  AST 16  ALT 13  ALKPHOS 61   BILITOT 0.5  PROT 7.2  ALBUMIN 4.3   No results for input(s): LIPASE, AMYLASE in the last 168 hours. No results for input(s): AMMONIA in the last 168 hours.  CBC:  Recent Labs Lab 12/13/13 1151 12/15/13 1305 12/15/13 1316  WBC  --  6.5  --   NEUTROABS  --  2.2  --   HGB 17.7* 15.5 15.3  HCT 52.0 44.4 45.0  MCV  --  93.3  --   PLT  --  165  --     Cardiac Enzymes: No results for input(s): CKTOTAL, CKMB, CKMBINDEX, TROPONINI in the last 168 hours.  Lipid Panel: No results for input(s): CHOL, TRIG, HDL, CHOLHDL, VLDL, LDLCALC in the last 168 hours.  CBG:  Recent Labs Lab 12/15/13 1316  GLUCAP 99    Microbiology: Results for orders placed or performed during the hospital encounter of 02/05/13  Culture, Group A Strep     Status: None   Collection Time: 02/05/13  9:20 AM  Result Value Ref Range Status   Specimen Description THROAT  Final   Special Requests NONE  Final   Culture   Final    No Beta Hemolytic Streptococci Isolated Performed at [facility]   Report Status 02/07/2013 FINAL  Final    Coagulation Studies:  Recent Labs  12/15/13 1305  LABPROT 14.5  INR 1.12    Imaging: Ct Head Wo Contrast  12/15/2013   CLINICAL DATA:  Code stroke, expressive aphasia.  EXAM: CT HEAD WITHOUT CONTRAST  TECHNIQUE: Contiguous axial images were obtained from the base of the skull through the vertex without intravenous contrast.  COMPARISON:  None.  FINDINGS: No evidence of an acute infarct, acute hemorrhage, mass lesion, mass effect or hydrocephalus. Visualized portions of the paranasal sinuses and mastoid air cells are clear.  IMPRESSION: Negative. These results were called by telephone at the time of interpretation on 12/15/2013 at 1:12 pm to Dr. Leroy Kennedy, who verbally acknowledged these results.   Electronically Signed   By: Leanna Battles M.D.   On: 12/15/2013 13:12       Assessment and plan discussed with with attending physician and they are in  agreement.    Felicie Morn PA-C [facility] [phone]  12/15/2013, 1:59 PM   Assessment: 28 y.o. male presenting to hospital with new onset expressive aphasia. Patient was recently seen by urgent care for HA over the last week and diagnosed with possible migraine.  He has continued to have a HA (per wife) however today was noted to have expressive difficulties.  Exam shows expressive aphasia and mainly in the form of dysnomia which is intermittent.  Urgent MRI was obtained showing no acute stroke. Likely complicated migraine versus TIA. Currently he has fully resolved.  Of note, patient's family indicated that he has been under significant  stress lately.  Stroke Risk Factors - none  1. HgbA1c, fasting lipid panel 2. MRI, MRA  of the brain without contrast 3. PT consult, OT consult, Speech consult 4. Echocardiogram 5. Carotid dopplers 6. Prophylactic therapy-Antiplatelet med: Aspirin - dose 81 mg daily 7. Risk factor modification 8. Telemetry monitoring 9. Frequent neuro checks  Patient seen and examined together with physician assistant and I concur with the assessment and plan.  Wyatt Portelasvaldo Corina Stacy, MD

## 2013-12-15 NOTE — H&P (Signed)
Patient Demographics  Joel Henry, is a 28 y.o. male  MRN: [id number]   DOB - 01/08/1986  Admit Date - 12/15/2013  Outpatient Primary MD for the patient is No PCP Per Patient   With History of -  Past Medical History  Diagnosis Date  . HA (headache)   . Migraine       History reviewed. No pertinent past surgical history.  in for   Chief Complaint  Patient presents with  . Code Stroke     HPI  Joel LoftJonathan Henry  is a 28 y.o. male,  With no past medical problems except for the recent diagnosis of Migraines 3 days ago, he describes to having headaches in the frontal and occipital area dull and constant for the last 3-4 days, he went to urgent care, he got some pain shots and the headache actually went away on Sunday and Monday, this morning around 11 AM he had an episode of mild headache which was similar to the previous one, accompanied by right-sided tingling numbness and some weakness, he also had extreme difficulty in forming words, he describes some blurry vision in the right lateral visual field, he then came to the ER.   In the ER his symptoms were rapidly resolving and almost completely resolved by the time I saw him. He had CT scan of the head along with MRI/MRA of the brain which were all unremarkable. He was seen by neurology and I was requested to admit for a TIA workup.    Review of Systems  Now -ve  In addition to the HPI above,  No Fever-chills, No Headache, No changes with Vision or hearing, No problems swallowing food or Liquids, No Chest pain, Cough or Shortness of Breath, No Abdominal pain, No Nausea or Vommitting, Bowel movements are regular, No Blood in stool or Urine, No dysuria, No new skin rashes or bruises, No new joints pains-aches,  No new weakness, tingling,  numbness in any extremity, No recent weight gain or loss, No polyuria, polydypsia or polyphagia, No significant Mental Stressors.  A full 10 point Review of Systems was done, except as stated above, all other Review of Systems were negative.   Social History History  Substance Use Topics  . Smoking status: Never Smoker   . Smokeless tobacco: Not on file  . Alcohol Use: No      Family History Family History  Problem Relation Age of Onset  . Hypertension Mother   . Hypertension Father       Prior to Admission medications   Medication Sig Start Date End Date Taking? Authorizing Provider  diclofenac (VOLTAREN) 75 MG EC tablet Take 1 tablet (75 mg total) by mouth 2 (two) times daily as needed. 12/13/13  Yes Rodolph BongEvan S Corey, MD  fluticasone (CUTIVATE) 0.05 % cream Apply topically 2 (two) times daily. Patient not taking: Reported on 12/15/2013 11/06/13   Linna HoffJames D Kindl, MD    No Known Allergies  Physical Exam  Vitals  Blood pressure 133/61, pulse 71, temperature 98.1 F (36.7 C), resp. rate 14, SpO2 99 %.   1. General young white male lying in bed in NAD,     2. Normal affect and insight, Not Suicidal or Homicidal, Awake Alert, Oriented X 3.  3. No F.N deficits, ALL C.Nerves Intact, Strength 5/5 all 4 extremities, Sensation intact all 4 extremities, Plantars down going.  4. Ears and Eyes appear Normal, Conjunctivae clear, PERRLA. Moist Oral Mucosa.  5. Supple Neck, No JVD, No cervical lymphadenopathy appriciated, No Carotid Bruits.  6. Symmetrical Chest wall movement, Good air movement bilaterally, CTAB.  7. RRR, No Gallops, Rubs or Murmurs, No Parasternal Heave.  8. Positive Bowel Sounds, Abdomen Soft, No tenderness, No organomegaly appriciated,No rebound -guarding or rigidity.  9.  No Cyanosis, Normal Skin Turgor, No Skin Rash or Bruise.  10. Good muscle tone,  joints appear normal , no effusions, Normal ROM.  11. No Palpable Lymph Nodes in Neck or Axillae      Data Review  CBC  Recent Labs Lab 12/13/13 1151 12/15/13 1305 12/15/13 1316  WBC  --  6.5  --   HGB 17.7* 15.5 15.3  HCT 52.0 44.4 45.0  PLT  --  165  --   MCV  --  93.3  --   MCH  --  32.6  --   MCHC  --  34.9  --   RDW  --  11.7  --   LYMPHSABS  --  3.6  --   MONOABS  --  0.7  --   EOSABS  --  0.1  --   BASOSABS  --  0.0  --    ------------------------------------------------------------------------------------------------------------------  Chemistries   Recent Labs Lab 12/13/13 1151 12/15/13 1305 12/15/13 1316  NA 140 141 141  K 3.9 3.8 3.7  CL 102 103 101  CO2  --  24  --   GLUCOSE 96 104* 107*  BUN 12 13 15   CREATININE 0.90 0.76 0.80  CALCIUM  --  9.0  --   AST  --  16  --   ALT  --  13  --   ALKPHOS  --  61  --   BILITOT  --  0.5  --    ------------------------------------------------------------------------------------------------------------------ CrCl cannot be calculated (Unknown ideal weight.). ------------------------------------------------------------------------------------------------------------------ No results for input(s): TSH, T4TOTAL, T3FREE, THYROIDAB in the last 72 hours.  Invalid input(s): FREET3   Coagulation profile  Recent Labs Lab 12/15/13 1305  INR 1.12   ------------------------------------------------------------------------------------------------------------------- No results for input(s): DDIMER in the last 72 hours. -------------------------------------------------------------------------------------------------------------------  Cardiac Enzymes No results for input(s): CKMB, TROPONINI, MYOGLOBIN in the last 168 hours.  Invalid input(s): CK ------------------------------------------------------------------------------------------------------------------ Invalid input(s):  POCBNP   ---------------------------------------------------------------------------------------------------------------  Urinalysis No results found for: COLORURINE, APPEARANCEUR, LABSPEC, PHURINE, GLUCOSEU, HGBUR, BILIRUBINUR, KETONESUR, PROTEINUR, UROBILINOGEN, NITRITE, LEUKOCYTESUR  ----------------------------------------------------------------------------------------------------------------  Imaging results:   Ct Head Wo Contrast  12/15/2013   CLINICAL DATA:  Code stroke, expressive aphasia.  EXAM: CT HEAD WITHOUT CONTRAST  TECHNIQUE: Contiguous axial images were obtained from the base of the skull through the vertex without intravenous contrast.  COMPARISON:  None.  FINDINGS: No evidence of an acute infarct, acute hemorrhage, mass lesion, mass effect or hydrocephalus. Visualized portions of the paranasal sinuses and mastoid air cells are clear.  IMPRESSION: Negative. These results were called by telephone at the time of interpretation on 12/15/2013 at 1:12 pm to Dr. Leroy Kennedy, who verbally acknowledged these results.  Electronically Signed   By: Leanna BattlesMelinda  Blietz M.D.   On: 12/15/2013 13:12   Mr Maxine GlennMra Head Wo Contrast  12/15/2013   ADDENDUM REPORT: 12/15/2013 15:09  ADDENDUM: Critical Value/emergent results were called by telephone at the time of interpretation on 12/15/2013 at 13:40 Pm to Dr. Cyril Mourningamillo , who verbally acknowledged these results.   Electronically Signed   By: Marlan Palauharles  Clark M.D.   On: 12/15/2013 15:09   12/15/2013   CLINICAL DATA:  Acute expressive aphasia.  Stroke.  Headache.  EXAM: MRI HEAD WITHOUT CONTRAST  MRA HEAD WITHOUT CONTRAST  TECHNIQUE: Multiplanar, multiecho pulse sequences of the brain and surrounding structures were obtained without intravenous contrast. Angiographic images of the head were obtained using MRA technique without contrast.  COMPARISON:  CT head 12/15/2013  FINDINGS: MRI HEAD FINDINGS  Negative for acute infarct  Negative for chronic ischemia.  Cerebral  white matter is normal.  Negative for hemorrhage or mass lesion.  Ventricle size is normal. No edema or shift of the midline structures.  Mild mucosal edema in the paranasal sinuses.  MRA HEAD FINDINGS  Left vertebral artery dominant. Both vertebral arteries are patent to the basilar. PICA patent bilaterally. Basilar widely patent. Superior cerebellar and posterior cerebral arteries widely patent.  Internal carotid artery widely patent bilaterally without stenosis. Anterior and middle cerebral arteries are widely patent  Negative for cerebral aneurysm.  IMPRESSION: No acute infarct  Normal MRI head  Normal MRA head  Electronically Signed: By: Marlan Palauharles  Clark M.D. On: 12/15/2013 14:12   Mr Brain Wo Contrast  12/15/2013   ADDENDUM REPORT: 12/15/2013 15:09  ADDENDUM: Critical Value/emergent results were called by telephone at the time of interpretation on 12/15/2013 at 13:40 Pm to Dr. Cyril Mourningamillo , who verbally acknowledged these results.   Electronically Signed   By: Marlan Palauharles  Clark M.D.   On: 12/15/2013 15:09   12/15/2013   CLINICAL DATA:  Acute expressive aphasia.  Stroke.  Headache.  EXAM: MRI HEAD WITHOUT CONTRAST  MRA HEAD WITHOUT CONTRAST  TECHNIQUE: Multiplanar, multiecho pulse sequences of the brain and surrounding structures were obtained without intravenous contrast. Angiographic images of the head were obtained using MRA technique without contrast.  COMPARISON:  CT head 12/15/2013  FINDINGS: MRI HEAD FINDINGS  Negative for acute infarct  Negative for chronic ischemia.  Cerebral white matter is normal.  Negative for hemorrhage or mass lesion.  Ventricle size is normal. No edema or shift of the midline structures.  Mild mucosal edema in the paranasal sinuses.  MRA HEAD FINDINGS  Left vertebral artery dominant. Both vertebral arteries are patent to the basilar. PICA patent bilaterally. Basilar widely patent. Superior cerebellar and posterior cerebral arteries widely patent.  Internal carotid artery widely  patent bilaterally without stenosis. Anterior and middle cerebral arteries are widely patent  Negative for cerebral aneurysm.  IMPRESSION: No acute infarct  Normal MRI head  Normal MRA head  Electronically Signed: By: Marlan Palauharles  Clark M.D. On: 12/15/2013 14:12    My personal review of EKG: Rhythm NSR, Rate  69 /min,   no Acute ST changes    Assessment & Plan  1. Expressive aphasia with right-sided tingling numbness and right lateral field blurry vision. All resolved, ? Complex migraine, stable head CT and MRI/MRA brain, likely represents TIA. Symptoms lasted for an hour therefore seizure less likely. Seen by neurology, will be admitted for TIA workup, telemetry monitor, carotid duplex, echogram, A1c, lipid panel, evaluation by PT-OT and speech therapy. Has passed bedside swallow. 81 mg of  aspirin for now per neurology. Check urine tox screen.    2. History of Migraine. Diagnosed with migraine 3 days ago, supportive Rx.    DVT Prophylaxis  Lovenox    AM Labs Ordered, also please review Full Orders  Family Communication: Admission, patients condition and plan of care including tests being ordered have been discussed with the patient and girl friend who indicate understanding and agree with the plan and Code Status.  Code Status Full  Likely DC to  Home  Condition Fair  Time spent in minutes : 35    SINGH,PRASHANT K M.D on 12/15/2013 at 3:44 PM  Between 7am to 7pm - Pager - [phone]  After 7pm go to [email or web address] - password [email or web address]  And look for the night coverage person covering me after hours  [facility] Office  [phone]

## 2013-12-16 ENCOUNTER — Observation Stay (HOSPITAL_COMMUNITY): Payer: BC Managed Care – PPO

## 2013-12-16 DIAGNOSIS — R4701 Aphasia: Secondary | ICD-10-CM

## 2013-12-16 DIAGNOSIS — G459 Transient cerebral ischemic attack, unspecified: Secondary | ICD-10-CM

## 2013-12-16 LAB — LIPID PANEL
Cholesterol: 111 mg/dL (ref 0–200)
HDL: 39 mg/dL — ABNORMAL LOW (ref >39)
LDL CALC: 51 mg/dL (ref 0–99)
Total CHOL/HDL Ratio: 2.8 RATIO
Triglycerides: 103 mg/dL (ref <150)
VLDL: 21 mg/dL (ref 0–40)

## 2013-12-16 LAB — URINALYSIS, ROUTINE W REFLEX MICROSCOPIC
Bilirubin Urine: NEGATIVE
Glucose, UA: NEGATIVE mg/dL
HGB URINE DIPSTICK: NEGATIVE
Ketones, ur: 15 mg/dL — AB
Leukocytes, UA: NEGATIVE
Nitrite: NEGATIVE
PH: 7 (ref 5.0–8.0)
Protein, ur: NEGATIVE mg/dL
SPECIFIC GRAVITY, URINE: 1.013 (ref 1.005–1.030)
Urobilinogen, UA: 0.2 mg/dL (ref 0.0–1.0)

## 2013-12-16 LAB — RAPID URINE DRUG SCREEN, HOSP PERFORMED
Amphetamines: NOT DETECTED
Barbiturates: NOT DETECTED
Benzodiazepines: NOT DETECTED
COCAINE: NOT DETECTED
Opiates: NOT DETECTED
Tetrahydrocannabinol: NOT DETECTED

## 2013-12-16 MED ORDER — TOPIRAMATE 25 MG PO TABS: 25.0000 mg | ORAL_TABLET | Freq: Two times a day (BID) | ORAL | Status: DC

## 2013-12-16 MED ORDER — TOPIRAMATE 25 MG PO TABS
25.0000 mg | ORAL_TABLET | Freq: Two times a day (BID) | ORAL | Status: DC
  Administered 2013-12-16: 25 mg via ORAL
  Filled 2013-12-16: qty 1

## 2013-12-16 NOTE — Progress Notes (Signed)
VASCULAR LAB PRELIMINARY  PRELIMINARY  PRELIMINARY  PRELIMINARY  Carotid duplex  completed.    Preliminary report:  Bilateral:  1-39% ICA stenosis.  Vertebral artery flow is antegrade.      Kerman Pfost, RVT 12/16/2013, 9:44 AM

## 2013-12-16 NOTE — Progress Notes (Signed)
OT Discharge Note  Patient Details Name: Joel Henry MRN: [id number] DOB: 02/29/1985   Cancelled Treatment:    Reason Eval/Treat Not Completed: OT screened, no needs identified, will sign off (screen by PT lynn)  Boone MasterJones, Arine Foley B 12/16/2013, 11:20 AM

## 2013-12-16 NOTE — Progress Notes (Addendum)
NEURO HOSPITALIST PROGRESS NOTE   SUBJECTIVE:                                                                                                                        Sleeping. Wife is at the bedside and tells me that he has been doing well today. Yesterday afternoon had another episode of transient language difficulty with a concomitant HA, nausea, and vomiting and non focal neuro-exam. EEG read as " abnormal EEG secondary to focal sharp wave activity in the central region on the left with phase reversal at C3. This finding is suggestive of a focal disturbance with epileptogenic potential, etiology nonspecific.  In addition, MRI/MRA brain, CUS, TTE, and lipid profile unimpressive" . Started on topamax 25 mg BID without reported side effects. OBJECTIVE:                                                                                                                           Vital signs in last 24 hours: Temp:  [97.6 F (36.4 C)-99 F (37.2 C)] 98.5 F (36.9 C) (11/25 1323) Pulse Rate:  [60-85] 85 (11/25 1323) Resp:  [14-20] 20 (11/25 1323) BP: (105-134)/(52-77) 132/65 mmHg (11/25 1323) SpO2:  [99 %-100 %] 99 % (11/25 1323) Weight:  [91.2 kg (201 lb 1 oz)] 91.2 kg (201 lb 1 oz) (11/24 1613)  Intake/Output from previous day: 11/24 0701 - 11/25 0700 In: -  Out: 700 [Urine:700] Intake/Output this shift: Total I/O In: 840 [P.O.:840] Out: -  Nutritional status:    Past Medical History  Diagnosis Date  . HA (headache)   . Migraine     Neurologic Exam:  Patient sleeping and thus will defer neuro-exam at this time.  Lab Results: Lab Results  Component Value Date/Time   CHOL 111 12/16/2013 06:54 AM   Lipid Panel  Recent Labs  12/16/13 0654  CHOL 111  TRIG 103  HDL 39*  CHOLHDL 2.8  VLDL 21  LDLCALC 51    Studies/Results: Dg Chest 2 View  12/15/2013   CLINICAL DATA:  Persistent headache, TIA, no chest complaints  EXAM: CHEST  2  VIEW  COMPARISON:  None.  FINDINGS: The heart size and mediastinal contours are within normal limits. Both lungs are  clear. The visualized skeletal structures are unremarkable.  IMPRESSION: No active cardiopulmonary disease.   Electronically Signed   By: Elige KoHetal  Patel   On: 12/15/2013 20:41   Ct Head Wo Contrast  12/15/2013   CLINICAL DATA:  Code stroke, expressive aphasia.  EXAM: CT HEAD WITHOUT CONTRAST  TECHNIQUE: Contiguous axial images were obtained from the base of the skull through the vertex without intravenous contrast.  COMPARISON:  None.  FINDINGS: No evidence of an acute infarct, acute hemorrhage, mass lesion, mass effect or hydrocephalus. Visualized portions of the paranasal sinuses and mastoid air cells are clear.  IMPRESSION: Negative. These results were called by telephone at the time of interpretation on 12/15/2013 at 1:12 pm to Dr. Leroy KennedyAMILO, who verbally acknowledged these results.   Electronically Signed   By: Leanna BattlesMelinda  Blietz M.D.   On: 12/15/2013 13:12   Mr Maxine GlennMra Head Wo Contrast  12/15/2013   ADDENDUM REPORT: 12/15/2013 15:09  ADDENDUM: Critical Value/emergent results were called by telephone at the time of interpretation on 12/15/2013 at 13:40 Pm to Dr. Cyril Mourningamillo , who verbally acknowledged these results.   Electronically Signed   By: Marlan Palauharles  Clark M.D.   On: 12/15/2013 15:09   12/15/2013   CLINICAL DATA:  Acute expressive aphasia.  Stroke.  Headache.  EXAM: MRI HEAD WITHOUT CONTRAST  MRA HEAD WITHOUT CONTRAST  TECHNIQUE: Multiplanar, multiecho pulse sequences of the brain and surrounding structures were obtained without intravenous contrast. Angiographic images of the head were obtained using MRA technique without contrast.  COMPARISON:  CT head 12/15/2013  FINDINGS: MRI HEAD FINDINGS  Negative for acute infarct  Negative for chronic ischemia.  Cerebral white matter is normal.  Negative for hemorrhage or mass lesion.  Ventricle size is normal. No edema or shift of the midline structures.   Mild mucosal edema in the paranasal sinuses.  MRA HEAD FINDINGS  Left vertebral artery dominant. Both vertebral arteries are patent to the basilar. PICA patent bilaterally. Basilar widely patent. Superior cerebellar and posterior cerebral arteries widely patent.  Internal carotid artery widely patent bilaterally without stenosis. Anterior and middle cerebral arteries are widely patent  Negative for cerebral aneurysm.  IMPRESSION: No acute infarct  Normal MRI head  Normal MRA head  Electronically Signed: By: Marlan Palauharles  Clark M.D. On: 12/15/2013 14:12   Mr Brain Wo Contrast  12/15/2013   ADDENDUM REPORT: 12/15/2013 15:09  ADDENDUM: Critical Value/emergent results were called by telephone at the time of interpretation on 12/15/2013 at 13:40 Pm to Dr. Cyril Mourningamillo , who verbally acknowledged these results.   Electronically Signed   By: Marlan Palauharles  Clark M.D.   On: 12/15/2013 15:09   12/15/2013   CLINICAL DATA:  Acute expressive aphasia.  Stroke.  Headache.  EXAM: MRI HEAD WITHOUT CONTRAST  MRA HEAD WITHOUT CONTRAST  TECHNIQUE: Multiplanar, multiecho pulse sequences of the brain and surrounding structures were obtained without intravenous contrast. Angiographic images of the head were obtained using MRA technique without contrast.  COMPARISON:  CT head 12/15/2013  FINDINGS: MRI HEAD FINDINGS  Negative for acute infarct  Negative for chronic ischemia.  Cerebral white matter is normal.  Negative for hemorrhage or mass lesion.  Ventricle size is normal. No edema or shift of the midline structures.  Mild mucosal edema in the paranasal sinuses.  MRA HEAD FINDINGS  Left vertebral artery dominant. Both vertebral arteries are patent to the basilar. PICA patent bilaterally. Basilar widely patent. Superior cerebellar and posterior cerebral arteries widely patent.  Internal carotid artery widely patent bilaterally without stenosis.  Anterior and middle cerebral arteries are widely patent  Negative for cerebral aneurysm.  IMPRESSION: No  acute infarct  Normal MRI head  Normal MRA head  Electronically Signed: By: Marlan Palauharles  Clark M.D. On: 12/15/2013 14:12    MEDICATIONS                                                                                                                        Scheduled: . aspirin  81 mg Oral Daily  . enoxaparin (LOVENOX) injection  40 mg Subcutaneous Q24H  . fluticasone   Topical BID  . topiramate  25 mg Oral BID    ASSESSMENT/PLAN:                                                                                                            28 [facility] due to acute onset transient language dysfunction, HA. Non focal neuro-exam, unremarkable neuroimaging and report of abnormal EEG as delineated above.  Unclear about the EEG findings as  the overall presentation is most consistent with new onset migraine with aura (started having HA approximately a week ago). A prolonged ambulatory EEG as outpatient should be able to provide further electrographic information regarding such finding. In any case, he will continue topamax 25 mg BID with further dose adjustment as outpatient which will be treating both migraine and possible focal onset seizure.  Of note, if he had a seizure, there was a focal seizure without impairment of consciousness and he should be able to drive without restrictions. Follow up with neurology in 3-4 weeks. Will sign off.  Wyatt Portelasvaldo Anothy Bufano, MD [facility] [phone]  12/16/2013, 1:46 PM

## 2013-12-16 NOTE — Evaluation (Signed)
Physical Therapy Evaluation Patient Details Name: Joel LoftJonathan Odenthal MRN: [id number] DOB: 27-Jul-1985 Today's Date: 12/16/2013   History of Present Illness  Adm 12/15/13 with Rt sided numbness, weakness, and aphasia. Symptoms resolved,however returned 12/16/13 pm. Head CT and MRI negative. PMHx- diagnosed with migraines 3-4 days PTA  Clinical Impression  Patient evaluated by Physical Therapy with no further acute PT needs identified. Pt with perfect scores on Berg Balance assessment and Dynamic Gait Index. Pt reports he feels all symptoms have resolved and is back to his baseline. All education has been completed and the patient has no further questions.  PT is signing off. Thank you for this referral.     Follow Up Recommendations No PT follow up    Equipment Recommendations  None recommended by PT    Recommendations for Other Services       Precautions / Restrictions Precautions Precautions: None      Mobility  Bed Mobility Overal bed mobility: Independent                Transfers Overall transfer level: Independent Equipment used: None                Ambulation/Gait Ambulation/Gait assistance: Independent Ambulation Distance (Feet): 300 Feet Assistive device: None Gait Pattern/deviations: WFL(Within Functional Limits)   Gait velocity interpretation: at or above normal speed for age/gender    Stairs Stairs: Yes Stairs assistance: Independent Stair Management: No rails;Alternating pattern;Forwards Number of Stairs: 2    Wheelchair Mobility    Modified Rankin (Stroke Patients Only) Modified Rankin (Stroke Patients Only) Pre-Morbid Rankin Score: No symptoms Modified Rankin: No symptoms     Balance Overall balance assessment: Independent                               Standardized Balance Assessment Standardized Balance Assessment : Berg Balance Test;Dynamic Gait Index Berg Balance Test Sit to Stand: Able to stand without using  hands and stabilize independently Standing Unsupported: Able to stand safely 2 minutes Sitting with Back Unsupported but Feet Supported on Floor or Stool: Able to sit safely and securely 2 minutes Stand to Sit: Sits safely with minimal use of hands Transfers: Able to transfer safely, minor use of hands Standing Unsupported with Eyes Closed: Able to stand 10 seconds safely Standing Ubsupported with Feet Together: Able to place feet together independently and stand 1 minute safely From Standing, Reach Forward with Outstretched Arm: Can reach confidently >25 cm (10") From Standing Position, Pick up Object from Floor: Able to pick up shoe safely and easily From Standing Position, Turn to Look Behind Over each Shoulder: Looks behind from both sides and weight shifts well Turn 360 Degrees: Able to turn 360 degrees safely in 4 seconds or less Standing Unsupported, Alternately Place Feet on Step/Stool: Able to stand independently and safely and complete 8 steps in 20 seconds Standing Unsupported, One Foot in Front: Able to place foot tandem independently and hold 30 seconds Standing on One Leg: Able to lift leg independently and hold > 10 seconds Total Score: 56 Dynamic Gait Index Level Surface: Normal Change in Gait Speed: Normal Gait with Horizontal Head Turns: Normal Gait with Vertical Head Turns: Normal Gait and Pivot Turn: Normal Step Over Obstacle: Normal Step Around Obstacles: Normal Steps: Normal Total Score: 24       Pertinent Vitals/Pain Pain Assessment: 0-10 Pain Score: 2  Pain Location: headache Pain Intervention(s): Limited activity within patient's tolerance  Home Living Family/patient expects to be discharged to:: Private residence Living Arrangements: Children;Spouse/significant other Available Help at Discharge: Family Type of Home: House Home Access: Stairs to enter   [employer] Corporation[employer]trance Stairs-Number of Steps: 1 Home Layout: Two level;Able to live on main level with  bedroom/bathroom Home Equipment: None      Prior Function Level of Independence: Independent         Comments: Accounting     Hand Dominance        Extremity/Trunk Assessment   Upper Extremity Assessment: Overall WFL for tasks assessed           Lower Extremity Assessment: Overall WFL for tasks assessed      Cervical / Trunk Assessment: Normal  Communication   Communication: No difficulties  Cognition Arousal/Alertness: Awake/alert Behavior During Therapy: WFL for tasks assessed/performed Overall Cognitive Status: Within Functional Limits for tasks assessed                      General Comments      Exercises        Assessment/Plan    PT Assessment Patent does not need any further PT services  PT Diagnosis Hemiplegia dominant side   PT Problem List    PT Treatment Interventions     PT Goals (Current goals can be found in the Care Plan section) Acute Rehab PT Goals PT Goal Formulation: All assessment and education complete, DC therapy    Frequency     Barriers to discharge        Co-evaluation               End of Session Equipment Utilized During Treatment: Gait belt Activity Tolerance: Patient tolerated treatment well Patient left: in bed;with call bell/phone within reach;with family/visitor present Nurse Communication: Mobility status (began independent sign-off)    Functional Assessment Tool Used: clinical judgement Functional Limitation: Mobility: Walking and moving around Mobility: Walking and Moving Around Current Status ([id number](G8978): 0 percent impaired, limited or restricted Mobility: Walking and Moving Around Goal Status [phone](G8979): 0 percent impaired, limited or restricted Mobility: Walking and Moving Around Discharge Status [phone](G8980): 0 percent impaired, limited or restricted    Time: 1054-1106 PT Time Calculation (min) (ACUTE ONLY): 12 min   Charges:   PT Evaluation $Initial PT Evaluation Tier I: 1 Procedure     PT G  Codes:   Functional Assessment Tool Used: clinical judgement Functional Limitation: Mobility: Walking and moving around    Shariya Gaster 12/16/2013, 11:16 AM Pager [phone]

## 2013-12-16 NOTE — Procedures (Signed)
ELECTROENCEPHALOGRAM REPORT   Patient: Joel Henry       Room #: 4N30 EEG No. ID: [id number] Age: 28 y.o.        Sex: male Referring Physician: Ghimire Report Date:  12/16/2013        Interpreting Physician: Thana FarrEYNOLDS,Myrtha Tonkovich D  History: Joel LoftJonathan John is an 28 y.o. male with episodes of transient language difficulty, HA, nausea, and vomiting   Medications:  Scheduled: . aspirin  81 mg Oral Daily  . enoxaparin (LOVENOX) injection  40 mg Subcutaneous Q24H  . fluticasone   Topical BID  . topiramate  25 mg Oral BID    Conditions of Recording:  This is a 16 channel EEG carried out with the patient in the awake and drowsy states.  Description:  The waking background activity consists of a low voltage, symmetrical, fairly well organized, 9 Hz alpha activity, seen from the parieto-occipital and posterior temporal regions.  Low voltage fast activity, poorly organized, is seen anteriorly and is at times superimposed on more posterior regions.  A mixture of theta and alpha rhythms are seen from the central and temporal regions. The patient drowses with slowing to irregular, low voltage theta and beta activity.   On infrequent occasions is noted sharp activity emanating from the left frontocentral region.  Phase reversal at C3.  Stage II sleep is not obtained. Hyperventilation and intermittent photic stimulation were not performed.   IMPRESSION: This is an abnormal EEG secondary to focal sharp wave activity in the central region on the left with phase reversal at C3.  This finding is suggestive of a focal disturbance with epileptogenic potential, etiology nonspecific.    Thana FarrLeslie Arilyn Brierley, MD [facility] [phone] 12/16/2013, 2:27 PM

## 2013-12-16 NOTE — Progress Notes (Deleted)
Discharge instructions reviewed with patient and family. No other concerns voice or distress noted. Family to transport.  Sim BoastHavy, RN

## 2013-12-16 NOTE — Progress Notes (Signed)
Discharge instructions reviewed with patient/family. RX given. Family to transport.  Sim BoastHavy, RN

## 2013-12-16 NOTE — Progress Notes (Signed)
  Echocardiogram 2D Echocardiogram has been performed.  Joel Henry FRANCES 12/16/2013, 8:56 AM

## 2013-12-16 NOTE — Plan of Care (Signed)
Problem: Discharge/Transitional Outcomes Goal: Independent mobility/functioning independent or with min Independent mobility/functioning independently or with minimal assistance  Outcome: Completed/Met Date Met:  12/16/13

## 2013-12-16 NOTE — Evaluation (Signed)
Speech Language Pathology Evaluation Patient Details Name: Joel Henry MRN: [id number] DOB: 03/07/1985 Today's Date: 12/16/2013 Time: [id number] SLP Time Calculation (min) (ACUTE ONLY): 16 min  Problem List:  Patient Active Problem List   Diagnosis Date Noted  . Expressive aphasia 12/15/2013  . TIA (transient ischemic attack) 12/15/2013  . Migraine   . Chronic migraine without aura without status migrainosus, not intractable   . Hip pain   . Stroke    Past Medical History:  Past Medical History  Diagnosis Date  . HA (headache)   . Migraine    Past Surgical History: History reviewed. No pertinent past surgical history. HPI:  Joel LoftJonathan Spader is a 28 y.o. male with no past medical problems except for the recent diagnosis of Migraines 3 days ago, who presented 12/15/13 with right-sided numbness/weakness and difficulty forming words. His symptoms rapidly resolved in the ED, and MRI was negative for acute infarct.   Assessment / Plan / Recommendation Clinical Impression  Pt's cognitive-linguistic function appears to have returned to baseline, with patient and wife in agreement. Speech is fluent and intelligible, and pt performs complex linguistic tasks without assistance. Very mild difficulties noted with divided attention, however pt says that this is typical for him. No further acute SLP services warranted at this time.     SLP Assessment  Patient does not need any further Speech Lanaguage Pathology Services    Follow Up Recommendations  None    Frequency and Duration        Pertinent Vitals/Pain Pain Assessment: No/denies pain   SLP Goals  Patient/Family Stated Goal: none stated  SLP Evaluation Prior Functioning  Cognitive/Linguistic Baseline: Within functional limits  Lives With: Spouse;Son (infant) Available Help at Discharge: Family Vocation: Full time employment   Cognition  Overall Cognitive Status: Within Functional Limits for tasks  assessed Orientation Level: Oriented X4    Comprehension  Auditory Comprehension Overall Auditory Comprehension: Appears within functional limits for tasks assessed Visual Recognition/Discrimination Discrimination: Within Function Limits Reading Comprehension Reading Status: Not tested    Expression Expression Primary Mode of Expression: Verbal Verbal Expression Overall Verbal Expression: Appears within functional limits for tasks assessed Written Expression Written Expression: Not tested   Oral / Motor Motor Speech Overall Motor Speech: Appears within functional limits for tasks assessed   GO Functional Assessment Tool Used: skilled clinical judgment Functional Limitations: Attention Attention Current Status (N5621(G9165): At least 1 percent but less than 20 percent impaired, limited or restricted Attention Goal Status (H0865(G9166): At least 1 percent but less than 20 percent impaired, limited or restricted Attention Discharge Status [phone](G9167): At least 1 percent but less than 20 percent impaired, limited or restricted     Maxcine HamLaura Paiewonsky, M.A. CCC-SLP [phone]  Maxcine Hamaiewonsky, Artie Mcintyre 12/16/2013, 10:53 AM

## 2013-12-16 NOTE — Progress Notes (Signed)
EEG Completed; Results Pending  

## 2013-12-16 NOTE — Discharge Summary (Signed)
PATIENT DETAILS Name: Joel Henry Age: 28 y.o. Sex: male Date of Birth: [birth year] MRN: [id number]. Admitting Physician: Leroy SeaPrashant K Singh, MD PCP:No PCP Per Patient  Admit Date: 12/15/2013 Discharge date: 12/16/2013  Recommendations for Outpatient Follow-up:  Started on Topamax, please continue to titrate/adjust medications in the outpatient setting. Neurology recommending prolonged or sleep deprived EEG  PRIMARY DISCHARGE DIAGNOSIS:  Principal Problem:   Expressive aphasia Active Problems:   TIA (transient ischemic attack)      PAST MEDICAL HISTORY: Past Medical History  Diagnosis Date  . HA (headache)   . Migraine     DISCHARGE MEDICATIONS: Current Discharge Medication List    START taking these medications   Details  topiramate (TOPAMAX) 25 MG tablet Take 1 tablet (25 mg total) by mouth 2 (two) times daily. Qty: 120 tablet, Refills: 0      CONTINUE these medications which have NOT CHANGED   Details  diclofenac (VOLTAREN) 75 MG EC tablet Take 1 tablet (75 mg total) by mouth 2 (two) times daily as needed. Qty: 30 tablet, Refills: 0    fluticasone (CUTIVATE) 0.05 % cream Apply topically 2 (two) times daily. Qty: 60 g, Refills: 0        ALLERGIES:  No Known Allergies  BRIEF HPI:  See H&P, Labs, Consult and Test reports for all details in brief, patient is a 28 year old male with no significant past medical history who was admitted with headaches and difficulty speaking.   CONSULTATIONS:   neurology  PERTINENT RADIOLOGIC STUDIES: Dg Chest 2 View  12/15/2013   CLINICAL DATA:  Persistent headache, TIA, no chest complaints  EXAM: CHEST  2 VIEW  COMPARISON:  None.  FINDINGS: The heart size and mediastinal contours are within normal limits. Both lungs are clear. The visualized skeletal structures are unremarkable.  IMPRESSION: No active cardiopulmonary disease.   Electronically Signed   By: Elige KoHetal  Patel   On: 12/15/2013 20:41   Ct Head Wo  Contrast  12/15/2013   CLINICAL DATA:  Code stroke, expressive aphasia.  EXAM: CT HEAD WITHOUT CONTRAST  TECHNIQUE: Contiguous axial images were obtained from the base of the skull through the vertex without intravenous contrast.  COMPARISON:  None.  FINDINGS: No evidence of an acute infarct, acute hemorrhage, mass lesion, mass effect or hydrocephalus. Visualized portions of the paranasal sinuses and mastoid air cells are clear.  IMPRESSION: Negative. These results were called by telephone at the time of interpretation on 12/15/2013 at 1:12 pm to Dr. Leroy KennedyAMILO, who verbally acknowledged these results.   Electronically Signed   By: Leanna BattlesMelinda  Blietz M.D.   On: 12/15/2013 13:12   Mr Maxine GlennMra Head Wo Contrast  12/15/2013   ADDENDUM REPORT: 12/15/2013 15:09  ADDENDUM: Critical Value/emergent results were called by telephone at the time of interpretation on 12/15/2013 at 13:40 Pm to Dr. Cyril Mourningamillo , who verbally acknowledged these results.   Electronically Signed   By: Marlan Palauharles  Clark M.D.   On: 12/15/2013 15:09   12/15/2013   CLINICAL DATA:  Acute expressive aphasia.  Stroke.  Headache.  EXAM: MRI HEAD WITHOUT CONTRAST  MRA HEAD WITHOUT CONTRAST  TECHNIQUE: Multiplanar, multiecho pulse sequences of the brain and surrounding structures were obtained without intravenous contrast. Angiographic images of the head were obtained using MRA technique without contrast.  COMPARISON:  CT head 12/15/2013  FINDINGS: MRI HEAD FINDINGS  Negative for acute infarct  Negative for chronic ischemia.  Cerebral white matter is normal.  Negative for hemorrhage or mass lesion.  Ventricle size is  normal. No edema or shift of the midline structures.  Mild mucosal edema in the paranasal sinuses.  MRA HEAD FINDINGS  Left vertebral artery dominant. Both vertebral arteries are patent to the basilar. PICA patent bilaterally. Basilar widely patent. Superior cerebellar and posterior cerebral arteries widely patent.  Internal carotid artery widely patent  bilaterally without stenosis. Anterior and middle cerebral arteries are widely patent  Negative for cerebral aneurysm.  IMPRESSION: No acute infarct  Normal MRI head  Normal MRA head  Electronically Signed: By: Marlan Palauharles  Clark M.D. On: 12/15/2013 14:12   Mr Brain Wo Contrast  12/15/2013   ADDENDUM REPORT: 12/15/2013 15:09  ADDENDUM: Critical Value/emergent results were called by telephone at the time of interpretation on 12/15/2013 at 13:40 Pm to Dr. Cyril Mourningamillo , who verbally acknowledged these results.   Electronically Signed   By: Marlan Palauharles  Clark M.D.   On: 12/15/2013 15:09   12/15/2013   CLINICAL DATA:  Acute expressive aphasia.  Stroke.  Headache.  EXAM: MRI HEAD WITHOUT CONTRAST  MRA HEAD WITHOUT CONTRAST  TECHNIQUE: Multiplanar, multiecho pulse sequences of the brain and surrounding structures were obtained without intravenous contrast. Angiographic images of the head were obtained using MRA technique without contrast.  COMPARISON:  CT head 12/15/2013  FINDINGS: MRI HEAD FINDINGS  Negative for acute infarct  Negative for chronic ischemia.  Cerebral white matter is normal.  Negative for hemorrhage or mass lesion.  Ventricle size is normal. No edema or shift of the midline structures.  Mild mucosal edema in the paranasal sinuses.  MRA HEAD FINDINGS  Left vertebral artery dominant. Both vertebral arteries are patent to the basilar. PICA patent bilaterally. Basilar widely patent. Superior cerebellar and posterior cerebral arteries widely patent.  Internal carotid artery widely patent bilaterally without stenosis. Anterior and middle cerebral arteries are widely patent  Negative for cerebral aneurysm.  IMPRESSION: No acute infarct  Normal MRI head  Normal MRA head  Electronically Signed: By: Marlan Palauharles  Clark M.D. On: 12/15/2013 14:12     PERTINENT LAB RESULTS: CBC:  Recent Labs  12/15/13 1305 12/15/13 1316 12/15/13 1653  WBC 6.5  --  6.4  HGB 15.5 15.3 14.8  HCT 44.4 45.0 41.6  PLT 165  --  164    CMET CMP     Component Value Date/Time   NA 141 12/15/2013 1316   K 3.7 12/15/2013 1316   CL 101 12/15/2013 1316   CO2 24 12/15/2013 1305   GLUCOSE 107* 12/15/2013 1316   BUN 15 12/15/2013 1316   CREATININE 0.76 12/15/2013 1653   CALCIUM 9.0 12/15/2013 1305   PROT 7.2 12/15/2013 1305   ALBUMIN 4.3 12/15/2013 1305   AST 16 12/15/2013 1305   ALT 13 12/15/2013 1305   ALKPHOS 61 12/15/2013 1305   BILITOT 0.5 12/15/2013 1305   GFRNONAA >90 12/15/2013 1653   GFRAA >90 12/15/2013 1653    GFR Estimated Creatinine Clearance: 157.4 mL/min (by C-G formula based on Cr of 0.76). No results for input(s): LIPASE, AMYLASE in the last 72 hours. No results for input(s): CKTOTAL, CKMB, CKMBINDEX, TROPONINI in the last 72 hours. Invalid input(s): POCBNP No results for input(s): DDIMER in the last 72 hours. No results for input(s): HGBA1C in the last 72 hours.  Recent Labs  12/16/13 0654  CHOL 111  HDL 39*  LDLCALC 51  TRIG 161103  CHOLHDL 2.8   No results for input(s): TSH, T4TOTAL, T3FREE, THYROIDAB in the last 72 hours.  Invalid input(s): FREET3 No results for input(s): VITAMINB12, FOLATE,  FERRITIN, TIBC, IRON, RETICCTPCT in the last 72 hours. Coags:  Recent Labs  12/15/13 1305  INR 1.12   Microbiology: No results found for this or any previous visit (from the past 240 hour(s)).   BRIEF HOSPITAL COURSE:   Principal Problem:   Expressive aphasia/headaches: Highly suspicious for new onset migraine with aura. Underwent workup including a MRI/MRA brain which was negative. Carotid Doppler was also negative for any significant stenosis. EEG showed a potential epileptogenic foci, case was discussed with Dr. Cyril Mourning on call neurologist, who did not think that these findings were significant. He recommended to continue with Topamax, that we started for migraine headaches, he thought that this should be good enough for possible seizure potential as well.Dr Cyril Mourning however is  recommending, that patient undergo an outpatient prolonged or a sleep deprived EEG. An outpatient appointment with neurology has been scheduled for January-see below.  TODAY-DAY OF DISCHARGE:  Subjective:   Locklan Canoy today has no headache,no chest abdominal pain,no new weakness tingling or numbness, feels much better wants to go home today.   Objective:   Blood pressure 132/65, pulse 85, temperature 98.5 F (36.9 C), temperature source Oral, resp. rate 20, height 5\' 11"  (1.803 m), weight 91.2 kg (201 lb 1 oz), SpO2 99 %.  Intake/Output Summary (Last 24 hours) at 12/16/13 1438 Last data filed at 12/16/13 1326  Gross per 24 hour  Intake    840 ml  Output    700 ml  Net    140 ml   Filed Weights   12/15/13 1613  Weight: 91.2 kg (201 lb 1 oz)    Exam Awake Alert, Oriented *3, No new F.N deficits, Normal affect [place].AT,PERRAL Supple Neck,No JVD, No cervical lymphadenopathy appriciated.  Symmetrical Chest wall movement, Good air movement bilaterally, CTAB RRR,No Gallops,Rubs or new Murmurs, No Parasternal Heave +ve B.Sounds, Abd Soft, Non tender, No organomegaly appriciated, No rebound -guarding or rigidity. No Cyanosis, Clubbing or edema, No new Rash or bruise  DISCHARGE CONDITION: Stable  DISPOSITION: Home  DISCHARGE INSTRUCTIONS:    Activity:  As tolerated   Per Dr Lessie Dings patient did not loose consciousness, ok to continue to drive.  Diet recommendation: Regular Diet  Discharge Instructions    Call MD for:    Complete by:  As directed   If difficulty speaking     Diet general    Complete by:  As directed      Increase activity slowly    Complete by:  As directed            Follow-up Information    Follow up with JAFFE, ADAM ROBERT, DO On 02/03/2014.   Specialty:  Neurology   Why:  appt at 9 am. Office will call if they have a earlier appointment. Please call and check with office if you need a earlier appt   Contact information:   [address] [place] [address] [phone]       Total Time spent on discharge equals 45 minutes.  SignedJeoffrey Massed 12/16/2013 2:38 PM

## 2013-12-16 NOTE — Progress Notes (Signed)
UR completed 

## 2013-12-17 LAB — HEMOGLOBIN A1C
Hgb A1c MFr Bld: 4.9 % (ref <5.7)
Mean Plasma Glucose: 94 mg/dL (ref <117)

## 2013-12-20 ENCOUNTER — Encounter (HOSPITAL_COMMUNITY): Payer: Self-pay | Admitting: *Deleted

## 2013-12-20 ENCOUNTER — Emergency Department (HOSPITAL_COMMUNITY)
Admission: EM | Admit: 2013-12-20 | Discharge: 2013-12-20 | Disposition: A | Discharge status: Home / Self Care | Payer: BC Managed Care – PPO | Attending: Emergency Medicine | Admitting: Emergency Medicine

## 2013-12-20 DIAGNOSIS — G43909 Migraine, unspecified, not intractable, without status migrainosus: Secondary | ICD-10-CM | POA: Insufficient documentation

## 2013-12-20 DIAGNOSIS — Z7951 Long term (current) use of inhaled steroids: Secondary | ICD-10-CM | POA: Diagnosis not present

## 2013-12-20 DIAGNOSIS — R51 Headache: Secondary | ICD-10-CM | POA: Diagnosis present

## 2013-12-20 DIAGNOSIS — Z79899 Other long term (current) drug therapy: Secondary | ICD-10-CM | POA: Diagnosis not present

## 2013-12-20 LAB — COMPREHENSIVE METABOLIC PANEL
ALT: 11 U/L (ref 0–53)
AST: 12 U/L (ref 0–37)
Albumin: 4.4 g/dL (ref 3.5–5.2)
Alkaline Phosphatase: 62 U/L (ref 39–117)
Anion gap: 11 (ref 5–15)
BUN: 17 mg/dL (ref 6–23)
CO2: 26 mEq/L (ref 19–32)
Calcium: 8.9 mg/dL (ref 8.4–10.5)
Chloride: 100 mEq/L (ref 96–112)
Creatinine, Ser: 0.9 mg/dL (ref 0.50–1.35)
GFR calc Af Amer: >90 mL/min (ref >90)
GFR calc non Af Amer: >90 mL/min (ref >90)
Glucose, Bld: 97 mg/dL (ref 70–99)
Potassium: 3.9 mEq/L (ref 3.7–5.3)
Sodium: 137 mEq/L (ref 137–147)
Total Bilirubin: 0.5 mg/dL (ref 0.3–1.2)
Total Protein: 7.1 g/dL (ref 6.0–8.3)

## 2013-12-20 LAB — CBC
HCT: 43.5 % (ref 39.0–52.0)
Hemoglobin: 17.2 g/dL — ABNORMAL HIGH (ref 13.0–17.0)
MCH: 32.9 pg (ref 26.0–34.0)
MCHC: 36.8 g/dL — ABNORMAL HIGH (ref 30.0–36.0)
MCV: 89.3 fL (ref 78.0–100.0)
Platelets: 184 10*3/uL (ref 150–400)
RBC: 4.87 MIL/uL (ref 4.22–5.81)
RDW: 11.5 % (ref 11.5–15.5)
WBC: 6.6 10*3/uL (ref 4.0–10.5)

## 2013-12-20 LAB — DIFFERENTIAL
Basophils Absolute: 0 10*3/uL (ref 0.0–0.1)
Basophils Relative: 0 % (ref 0–1)
Eosinophils Absolute: 0.1 10*3/uL (ref 0.0–0.7)
Eosinophils Relative: 2 % (ref 0–5)
Lymphocytes Relative: 45 % (ref 12–46)
Lymphs Abs: 3 10*3/uL (ref 0.7–4.0)
Monocytes Absolute: 0.7 10*3/uL (ref 0.1–1.0)
Monocytes Relative: 11 % (ref 3–12)
Neutro Abs: 2.8 10*3/uL (ref 1.7–7.7)
Neutrophils Relative %: 42 % — ABNORMAL LOW (ref 43–77)

## 2013-12-20 MED ORDER — SODIUM CHLORIDE 0.9 % IV BOLUS (SEPSIS)
1000.0000 mL | Freq: Once | INTRAVENOUS | Status: AC
  Administered 2013-12-20: 1000 mL via INTRAVENOUS

## 2013-12-20 NOTE — ED Notes (Addendum)
Pt and family reports pt was seen here on Tuesday and admitted due to expressive aphasia. Pt was diagnosed with complex migraine. Reports having improvement and then onset approx 30-60 mins ago of expressive aphasia, headache and numbness/tingling sensation to right side of body. Pts speech is clear at triage, just having some difficulty expressing himself and slow to answer questions. No other neuro deficits noted.

## 2013-12-20 NOTE — ED Provider Notes (Signed)
CSN: [id number]     Arrival date & time 12/20/13  1735 History   First MD Initiated Contact with Patient 12/20/13 1817     Chief Complaint  Patient presents with  . Headache  . Numbness     (Consider location/radiation/quality/duration/timing/severity/associated sxs/prior Treatment) HPI Comments: Patient with recent diagnosis of complex migraines, presents to the emergency department with chief complaints of headache. He states that his headache started several hours ago. He reports associated brief expressive aphasia as well as a brief episode of right arm numbness. He states these symptoms have since completely resolved. Symptom onset and resolution was associated with onset and resolution of the headache.  He denies any symptoms at this time. He states that he took and excedrin migraine.  He denies any fevers or chills.  He states that the symptoms are the same as what he experienced earlier this week.  He was admitted earlier and had a normal head CT and MRI.  He is scheduled to follow-up on outpatient basis. He is taking Topomax for his migraines.  The history is provided by the patient. No language interpreter was used.    Past Medical History  Diagnosis Date  . HA (headache)   . Migraine    History reviewed. No pertinent past surgical history. Family History  Problem Relation Age of Onset  . Hypertension Mother   . Hypertension Father    History  Substance Use Topics  . Smoking status: Never Smoker   . Smokeless tobacco: Not on file  . Alcohol Use: No    Review of Systems  Constitutional: Negative for fever and chills.  Respiratory: Negative for shortness of breath.   Cardiovascular: Negative for chest pain.  Gastrointestinal: Negative for nausea, vomiting, diarrhea and constipation.  Genitourinary: Negative for dysuria.  Neurological: Positive for numbness and headaches.  All other systems reviewed and are negative.     Allergies  Review of patient's allergies  indicates no known allergies.  Home Medications   Prior to Admission medications   Medication Sig Start Date End Date Taking? Authorizing Provider  aspirin-acetaminophen-caffeine (EXCEDRIN MIGRAINE) [phone] [id number] MG per tablet Take 2 tablets by mouth every 6 (six) hours as needed for headache or migraine.   Yes Historical Provider, MD  diclofenac (VOLTAREN) 75 MG EC tablet Take 1 tablet (75 mg total) by mouth 2 (two) times daily as needed. Patient taking differently: Take 75 mg by mouth 2 (two) times daily as needed (pain).  12/13/13  Yes Rodolph BongEvan S Corey, MD  Diphenhydramine-APAP, sleep, (EXCEDRIN PM) 38-500 MG TABS Take 1 tablet by mouth at bedtime as needed (sleep).   Yes Historical Provider, MD  Multiple Vitamin (MULTIVITAMIN WITH MINERALS) TABS tablet Take 1 tablet by mouth at bedtime.   Yes Historical Provider, MD  PRESCRIPTION MEDICATION Inhale 2 puffs into the lungs every 4 (four) hours as needed (allergies). inhaler   Yes Historical Provider, MD  topiramate (TOPAMAX) 25 MG tablet Take 1 tablet (25 mg total) by mouth 2 (two) times daily. 12/16/13  Yes Shanker Levora DredgeM Ghimire, MD  fluticasone (CUTIVATE) 0.05 % cream Apply topically 2 (two) times daily. Patient not taking: Reported on 12/15/2013 11/06/13   Linna HoffJames D Kindl, MD   BP 142/72 mmHg  Pulse 67  Temp(Src) 97.7 F (36.5 C) (Oral)  Resp 16  Ht 5\' 11"  (1.803 m)  Wt 190 lb (86.183 kg)  BMI 26.51 kg/m2  SpO2 100% Physical Exam  Constitutional: He is oriented to person, place, and time. He appears well-developed and  well-nourished.  HENT:  Head: Normocephalic and atraumatic.  Right Ear: External ear normal.  Left Ear: External ear normal.  Eyes: Conjunctivae and EOM are normal. Pupils are equal, round, and reactive to light.  Neck: Normal range of motion. Neck supple.  No pain with neck flexion, no meningismus  Cardiovascular: Normal rate, regular rhythm and normal heart sounds.  Exam reveals no gallop and no friction rub.   No murmur  heard. Pulmonary/Chest: Effort normal and breath sounds normal. No respiratory distress. He has no wheezes. He has no rales. He exhibits no tenderness.  Abdominal: Soft. He exhibits no distension and no mass. There is no tenderness. There is no rebound and no guarding.  Musculoskeletal: Normal range of motion. He exhibits no edema or tenderness.  Normal gait.  Neurological: He is alert and oriented to person, place, and time. He has normal reflexes.  CN 3-12 intact, normal finger to nose, no pronator drift, sensation and strength intact bilaterally.  Speech is clear  Skin: Skin is warm and dry.  Psychiatric: He has a normal mood and affect. His behavior is normal. Judgment and thought content normal.  Nursing note and vitals reviewed.   ED Course  Procedures (including critical care time) Results for orders placed or performed during the hospital encounter of 12/20/13  CBC  Result Value Ref Range   WBC 6.6 4.0 - 10.5 K/uL   RBC 4.87 4.22 - 5.81 MIL/uL   Hemoglobin 17.2 (H) 13.0 - 17.0 g/dL   HCT 16.1 09.6 - 04.5 %   MCV 89.3 78.0 - 100.0 fL   MCH 32.9 26.0 - 34.0 pg   MCHC 36.8 (H) 30.0 - 36.0 g/dL   RDW 40.9 81.1 - 91.4 %   Platelets 184 150 - 400 K/uL  Differential  Result Value Ref Range   Neutrophils Relative % 42 (L) 43 - 77 %   Neutro Abs 2.8 1.7 - 7.7 K/uL   Lymphocytes Relative 45 12 - 46 %   Lymphs Abs 3.0 0.7 - 4.0 K/uL   Monocytes Relative 11 3 - 12 %   Monocytes Absolute 0.7 0.1 - 1.0 K/uL   Eosinophils Relative 2 0 - 5 %   Eosinophils Absolute 0.1 0.0 - 0.7 K/uL   Basophils Relative 0 0 - 1 %   Basophils Absolute 0.0 0.0 - 0.1 K/uL  Comprehensive metabolic panel  Result Value Ref Range   Sodium 137 137 - 147 mEq/L   Potassium 3.9 3.7 - 5.3 mEq/L   Chloride 100 96 - 112 mEq/L   CO2 26 19 - 32 mEq/L   Glucose, Bld 97 70 - 99 mg/dL   BUN 17 6 - 23 mg/dL   Creatinine, Ser 7.82 0.50 - 1.35 mg/dL   Calcium 8.9 8.4 - 95.6 mg/dL   Total Protein 7.1 6.0 - 8.3 g/dL    Albumin 4.4 3.5 - 5.2 g/dL   AST 12 0 - 37 U/L   ALT 11 0 - 53 U/L   Alkaline Phosphatase 62 39 - 117 U/L   Total Bilirubin 0.5 0.3 - 1.2 mg/dL   GFR calc non Af Amer >90 >90 mL/min   GFR calc Af Amer >90 >90 mL/min   Anion gap 11 5 - 15   Dg Chest 2 View  12/15/2013   CLINICAL DATA:  Persistent headache, TIA, no chest complaints  EXAM: CHEST  2 VIEW  COMPARISON:  None.  FINDINGS: The heart size and mediastinal contours are within normal limits. Both lungs  are clear. The visualized skeletal structures are unremarkable.  IMPRESSION: No active cardiopulmonary disease.   Electronically Signed   By: Elige KoHetal  Patel   On: 12/15/2013 20:41   Ct Head Wo Contrast  12/15/2013   CLINICAL DATA:  Code stroke, expressive aphasia.  EXAM: CT HEAD WITHOUT CONTRAST  TECHNIQUE: Contiguous axial images were obtained from the base of the skull through the vertex without intravenous contrast.  COMPARISON:  None.  FINDINGS: No evidence of an acute infarct, acute hemorrhage, mass lesion, mass effect or hydrocephalus. Visualized portions of the paranasal sinuses and mastoid air cells are clear.  IMPRESSION: Negative. These results were called by telephone at the time of interpretation on 12/15/2013 at 1:12 pm to Dr. Leroy KennedyAMILO, who verbally acknowledged these results.   Electronically Signed   By: Leanna BattlesMelinda  Blietz M.D.   On: 12/15/2013 13:12   Mr Maxine GlennMra Head Wo Contrast  12/15/2013   ADDENDUM REPORT: 12/15/2013 15:09  ADDENDUM: Critical Value/emergent results were called by telephone at the time of interpretation on 12/15/2013 at 13:40 Pm to Dr. Cyril Mourningamillo , who verbally acknowledged these results.   Electronically Signed   By: Marlan Palauharles  Clark M.D.   On: 12/15/2013 15:09   12/15/2013   CLINICAL DATA:  Acute expressive aphasia.  Stroke.  Headache.  EXAM: MRI HEAD WITHOUT CONTRAST  MRA HEAD WITHOUT CONTRAST  TECHNIQUE: Multiplanar, multiecho pulse sequences of the brain and surrounding structures were obtained without intravenous  contrast. Angiographic images of the head were obtained using MRA technique without contrast.  COMPARISON:  CT head 12/15/2013  FINDINGS: MRI HEAD FINDINGS  Negative for acute infarct  Negative for chronic ischemia.  Cerebral white matter is normal.  Negative for hemorrhage or mass lesion.  Ventricle size is normal. No edema or shift of the midline structures.  Mild mucosal edema in the paranasal sinuses.  MRA HEAD FINDINGS  Left vertebral artery dominant. Both vertebral arteries are patent to the basilar. PICA patent bilaterally. Basilar widely patent. Superior cerebellar and posterior cerebral arteries widely patent.  Internal carotid artery widely patent bilaterally without stenosis. Anterior and middle cerebral arteries are widely patent  Negative for cerebral aneurysm.  IMPRESSION: No acute infarct  Normal MRI head  Normal MRA head  Electronically Signed: By: Marlan Palauharles  Clark M.D. On: 12/15/2013 14:12   Mr Brain Wo Contrast  12/15/2013   ADDENDUM REPORT: 12/15/2013 15:09  ADDENDUM: Critical Value/emergent results were called by telephone at the time of interpretation on 12/15/2013 at 13:40 Pm to Dr. Cyril Mourningamillo , who verbally acknowledged these results.   Electronically Signed   By: Marlan Palauharles  Clark M.D.   On: 12/15/2013 15:09   12/15/2013   CLINICAL DATA:  Acute expressive aphasia.  Stroke.  Headache.  EXAM: MRI HEAD WITHOUT CONTRAST  MRA HEAD WITHOUT CONTRAST  TECHNIQUE: Multiplanar, multiecho pulse sequences of the brain and surrounding structures were obtained without intravenous contrast. Angiographic images of the head were obtained using MRA technique without contrast.  COMPARISON:  CT head 12/15/2013  FINDINGS: MRI HEAD FINDINGS  Negative for acute infarct  Negative for chronic ischemia.  Cerebral white matter is normal.  Negative for hemorrhage or mass lesion.  Ventricle size is normal. No edema or shift of the midline structures.  Mild mucosal edema in the paranasal sinuses.  MRA HEAD FINDINGS  Left  vertebral artery dominant. Both vertebral arteries are patent to the basilar. PICA patent bilaterally. Basilar widely patent. Superior cerebellar and posterior cerebral arteries widely patent.  Internal carotid artery widely patent bilaterally  without stenosis. Anterior and middle cerebral arteries are widely patent  Negative for cerebral aneurysm.  IMPRESSION: No acute infarct  Normal MRI head  Normal MRA head  Electronically Signed: By: Marlan Palau M.D. On: 12/15/2013 14:12      EKG Interpretation None      MDM   Final diagnoses:  Migraine without status migrainosus, not intractable, unspecified migraine type    Patient with recent diagnosis of complex migraines with associated numbness and expressive aphasia.  Patient had similar symptoms today.  The symptoms have resolved.  He had a negative workup and admission earlier this week in which a head CT and MRI were done.  He has no neck pain or traumatic injury, nothing to suggest vertebral artery dissection.  The symptoms start when he gets his headaches, and resolved when the headaches improve.  It sounds like the symptoms are characteristic of recent diagnosis of complex migraines.  Patient is feeling well now.  He is neurovascularly intact.  He is stable and ready for discharge to home.  Patient discussed with Dr. Juleen China, who agrees with the plan.  8:55 PM Patient reassessed.  Still symptom free.  States that he is comfortable going home.  I advised him to call his neurologist in the morning to see if he can get in any sooner.  Roxy Horseman, PA-C 12/20/13 2126  Raeford Razor, MD 12/23/13 [phone]

## 2013-12-20 NOTE — ED Notes (Signed)
Pt monitored by pulse ox, bp cuff, and 5-lead. 

## 2013-12-20 NOTE — Discharge Instructions (Signed)

## 2013-12-23 ENCOUNTER — Encounter: Payer: Self-pay | Admitting: Neurology

## 2013-12-23 ENCOUNTER — Ambulatory Visit (INDEPENDENT_AMBULATORY_CARE_PROVIDER_SITE_OTHER): Payer: BC Managed Care – PPO | Admitting: Neurology

## 2013-12-23 VITALS — BP 130/66 | HR 90 | Temp 98.4°F | Resp 20 | Ht 72.0 in | Wt 196.4 lb

## 2013-12-23 DIAGNOSIS — G43119 Migraine with aura, intractable, without status migrainosus: Secondary | ICD-10-CM

## 2013-12-23 NOTE — Patient Instructions (Signed)
1.  Continue the topamax (you can take 50mg  all at bedtime) 2.  When you first get the headache, then take a pain reliever (naproxen 500mg , ibuprofen, Excedrin migraine, or dicolofenac 75mg ) 3.  If headache persists for extended period of time, please call office 4.  Consider limiting caffeine intake. 5.  Follow up in 4 weeks.

## 2013-12-23 NOTE — Progress Notes (Signed)
NEUROLOGY CONSULTATION NOTE  Joel Henry MRN: [id number] DOB: 01/01/1986  Referring provider: Hospital referral Primary care provider: Dr. Waynard EdwardsPerini  Reason for consult:  Migraine, new onset  HISTORY OF PRESENT ILLNESS: Joel Henry is a 28 year old ambidextrous man who presents for migraine.  Records, labs and imaging reviewed.  He is accompanied by his wife, who also provides some history  Earlier this month, he developed a gradual onset of headache, which lasted a week and a half.  It is holocephalic, throbbing, 7-8/10 and can be associated with nausea, vomiting and phonophobia but not photophobia, osmophobia, visual disturbance or autonomic symptoms.  He took Tylenol, which didn't help.  He went to urgent care and was prescribed diclofenac, which helped.  He was [facility] 12[facility]15/13 after developing right sided numbness, starting in the foot and traveling up his body to include the face.  This was accompanied by slurred speech and difficulty getting words out.  It lasted about an hour.  It was accompanied by the headache.  He had a second episode in the ED which resolved after 45 minutes.  CT of the head was unremarkable.  MRI and MRA of the brain were unremarkable.  Carotid dopplers were negative.  2D echo showed LVEF of 55-60%.  Hgb A1c was 4.9 and LDL was 51.  Urine drug screen was negative. He also had an EEG which revealed a "focal sharp wave activity in the central region on the left with phase reversal at C3," which was not thought to be significant.  I personally reviewed the EEG and did not appreciate this.  He was started on Topamax 25mg  twice daily.  He had another episode on 12/20/13, with headache and associated with expressive aphasia and brief right sided numbness, this time lasting 45 minutes.  Excedrin migraine didn't seem to help.  CBC and CMP were within normal limits. Prior to onset of these episodes, he reports some increased stress at work, but  nothing too dramatic.  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  He denies any head trauma or fever or illness.  He reports past history of mild holocephalic headaches which responded to Tylenol.  His mother has history of headaches.  He has no personal history or family history of seizures.  PAST MEDICAL HISTORY: Past Medical History  Diagnosis Date  . HA (headache)   . Migraine     PAST SURGICAL HISTORY: No past surgical history on file.  MEDICATIONS: Current Outpatient Prescriptions on File Prior to Visit  Medication Sig Dispense Refill  . aspirin-acetaminophen-caffeine (EXCEDRIN MIGRAINE) 250-250-65 MG per tablet Take 2 tablets by mouth every 6 (six) hours as needed for headache or migraine.    . diclofenac (VOLTAREN) 75 MG EC tablet Take 1 tablet (75 mg total) by mouth 2 (two) times daily as needed. (Patient taking differently: Take 75 mg by mouth 2 (two) times daily as needed (pain). ) 30 tablet 0  . Multiple Vitamin (MULTIVITAMIN WITH MINERALS) TABS tablet Take 1 tablet by mouth at bedtime.    [name]. PRESCRIPTION MEDICATION Inhale 2 puffs into the lungs every 4 (four) hours as needed (allergies). inhaler    . topiramate (TOPAMAX) 25 MG tablet Take 1 tablet (25 mg total) by mouth 2 (two) times daily. 120 tablet 0  . Diphenhydramine-APAP, sleep, (EXCEDRIN PM) 38-500 MG TABS Take 1 tablet by mouth at bedtime as needed (sleep).    . fluticasone (CUTIVATE) 0.05 % cream Apply topically 2 (two) times  daily. (Patient not taking: Reported on 12/15/2013) 60 g 0   No current facility-administered medications on file prior to visit.    ALLERGIES: No Known Allergies  FAMILY HISTORY: Family History  Problem Relation Age of Onset  . Hypertension Father   . Cancer Maternal Grandmother     breast  . Heart failure Paternal Grandfather     SOCIAL HISTORY: History   Social History  . Marital Status: Married    Spouse Name: N/A    Number of Children: N/A  . Years of  Education: N/A   Occupational History  . Not on file.   Social History Main Topics  . Smoking status: Never Smoker   . Smokeless tobacco: Not on file  . Alcohol Use: No  . Drug Use: No  . Sexual Activity:    Partners: Female   Other Topics Concern  . Not on file   Social History Narrative    REVIEW OF SYSTEMS: Constitutional: No fevers, chills, or sweats, no generalized fatigue, change in appetite Eyes: No visual changes, double vision, eye pain Ear, nose and throat: No hearing loss, ear pain, nasal congestion, sore throat Cardiovascular: No chest pain, palpitations Respiratory:  No shortness of breath at rest or with exertion, wheezes GastrointestinaI: No nausea, vomiting, diarrhea, abdominal pain, fecal incontinence Genitourinary:  No dysuria, urinary retention or frequency Musculoskeletal:  No neck pain, back pain Integumentary: No rash, pruritus, skin lesions Neurological: as above Psychiatric: No depression, insomnia, anxiety Endocrine: No palpitations, fatigue, diaphoresis, mood swings, change in appetite, change in weight, increased thirst Hematologic/Lymphatic:  No anemia, purpura, petechiae. Allergic/Immunologic: no itchy/runny eyes, nasal congestion, recent allergic reactions, rashes  PHYSICAL EXAM: Filed Vitals:   12/23/13 0749  BP: 130/66  Pulse: 90  Temp: 98.4 F (36.9 C)  Resp: 20   General: No acute distress Head:  Normocephalic/atraumatic Eyes:  fundi unremarkable, without vessel changes, exudates, hemorrhages or papilledema. Neck: supple, no paraspinal tenderness, full range of motion Back: No paraspinal tenderness Heart: regular rate and rhythm Lungs: Clear to auscultation bilaterally. Vascular: No carotid bruits. Neurological Exam: Mental status: alert and oriented to person, place, and time, recent and remote memory intact, fund of knowledge intact, attention and concentration intact, speech fluent and not dysarthric, language intact. Cranial  nerves: CN I: not tested CN II: pupils equal, round and reactive to light, visual fields intact, fundi unremarkable, without vessel changes, exudates, hemorrhages or papilledema. CN III, IV, VI:  full range of motion, no nystagmus, no ptosis CN V: facial sensation intact CN VII: upper and lower face symmetric CN VIII: hearing intact CN IX, X: gag intact, uvula midline CN XI: sternocleidomastoid and trapezius muscles intact CN XII: tongue midline Bulk & Tone: normal, no fasciculations. Motor:  5/5 throughout Sensation:  Temperature and vibration intact Deep Tendon Reflexes:  2+ throughout, toes downgoing Finger to nose testing:  No dysmetria Heel to shin:  No dysmetria Gait:  Normal station and stride.  Able to turn and walk in tandem. Romberg negative.  IMPRESSION: Migraine with aura, intractable  PLAN: 1.  Continue the topamax (can take 50mg  all at bedtime) 2.  For abortive therapy, naproxen 500mg , ibuprofen, retry Excedrin migraine, or dicolofenac 75mg  3.  If headache persists for extended period of time, please call office 4.  Consider limiting caffeine intake. 5.  Follow up in 4 weeks.  Thank you for allowing me to take part in the care of this patient.  Shon MilletAdam Jaffe, DO  CC: Rodrigo RanMark Perini, MD

## 2013-12-30 ENCOUNTER — Telehealth: Payer: Self-pay | Admitting: Neurology

## 2013-12-30 ENCOUNTER — Telehealth: Payer: Self-pay | Admitting: *Deleted

## 2013-12-30 NOTE — Telephone Encounter (Signed)
Patient states the Topamax is making him not sleep and he is feeling very depressed  like he is in a different body a scarry feeling . He is not having headache at all Please advise

## 2013-12-30 NOTE — Telephone Encounter (Signed)
Left message to return call to office.

## 2013-12-30 NOTE — Telephone Encounter (Signed)
Zoloft 50 mg 1 PO at HS  # 30 with 2 refills called to pharmacy patient is aware

## 2013-12-30 NOTE — Telephone Encounter (Signed)
If the sleep issue is the primary problem, he can take the dose in the morning.  If the concern is thinking that the topamax is causing depression, then we can switch topiramate to Zoloft 50mg  at bedtime.  I give each dose 4 weeks before making any changes unless experiencing side effects.

## 2013-12-30 NOTE — Telephone Encounter (Signed)
Pt called wanting to speak to a nurse regarding the side effects he is experiencing from Topamax. Side effects: can not sleep and depression. C/B [phone]

## 2014-01-25 ENCOUNTER — Ambulatory Visit (INDEPENDENT_AMBULATORY_CARE_PROVIDER_SITE_OTHER): Payer: BLUE CROSS/BLUE SHIELD | Admitting: Neurology

## 2014-01-25 ENCOUNTER — Encounter: Payer: Self-pay | Admitting: Neurology

## 2014-01-25 VITALS — BP 130/78 | HR 80 | Temp 98.7°F | Resp 18 | Ht 72.0 in | Wt 201.3 lb

## 2014-01-25 DIAGNOSIS — G43109 Migraine with aura, not intractable, without status migrainosus: Secondary | ICD-10-CM

## 2014-01-25 NOTE — Patient Instructions (Signed)
Continue Zoloft  Follow up in 3 months.  Call with questions or concerns.

## 2014-01-25 NOTE — Progress Notes (Signed)
NEUROLOGY FOLLOW UP OFFICE NOTE  Joel Henry [id number]  HISTORY OF PRESENT ILLNESS: Joel Henry is a 29 year old ambidextrous man who follows up for migraine.  Joel Henry  He is accompanied by his wife, who also provides some history  UPDATE: The Topamax was not making him feel right, so he was switched to Zoloft  daily.  He has been doing well.  He had only one dull headache since last visit.  HISTORY: In November, he developed a gradual onset of headache, which lasted a week and a half.  It is holocephalic, throbbing, 7-8/10 and can be associated with nausea, vomiting and phonophobia but not photophobia, osmophobia, visual disturbance or autonomic symptoms.  He took Tylenol, which didn't help.  He went to urgent care and was prescribed diclofenac, which helped.  He was admitted to [facility] on 12/15/13 after developing right sided numbness, starting in the foot and traveling up his body to include the face.  This was accompanied by slurred speech and difficulty getting words out.  It lasted about an hour.  It was accompanied by the headache.  He had a second episode in the ED which resolved after 45 minutes.  CT of the head was unremarkable.  MRI and MRA of the brain were unremarkable.  Carotid dopplers were negative.  2D echo showed LVEF of 55-60%.  Hgb A1c was 4.9 and LDL was 51.  Urine drug screen was negative. He also had an EEG which revealed a "focal sharp wave activity in the central region on the left with phase reversal at C3," which was not thought to be significant.  I personally reviewed the EEG and did not appreciate this.  He was started on Topamax  twice daily.  He had another episode on 12/20/13, with headache and associated with expressive aphasia and brief right sided numbness, this time lasting 45 minutes.  Excedrin migraine didn't seem to help.  CBC and CMP were within normal limits. Prior to onset of these episodes, he reports some increased stress at work, but  nothing too dramatic.  He is an Airline pilot, working in [employer].  He denies any head trauma or fever or illness.  He reports past history of mild holocephalic headaches which responded to Tylenol.  His mother has history of headaches.  He has no personal history or family history of seizures.  PAST MEDICAL HISTORY: Past Medical History  Diagnosis Date  . HA (headache)   . Migraine     MEDICATIONS: Current Outpatient Prescriptions on File Prior to Visit  Medication Sig Dispense Refill  . aspirin-acetaminophen-caffeine (EXCEDRIN MIGRAINE) 250-250-65 MG per tablet Take 2 tablets by mouth every 6 (six) hours as needed for headache or migraine.    . diclofenac (VOLTAREN) 75 MG EC tablet Take 1 tablet (75 mg total) by mouth 2 (two) times daily as needed. (Patient taking differently: Take 75 mg by mouth 2 (two) times daily as needed (pain). ) 30 tablet 0  . Diphenhydramine-APAP, sleep, (EXCEDRIN PM) 38-500 MG TABS Take 1 tablet by mouth at bedtime as needed (sleep).    . fluticasone (CUTIVATE) 0.05 % cream Apply topically 2 (two) times daily. 60 g 0  . Multiple Vitamin (MULTIVITAMIN WITH MINERALS) TABS tablet Take 1 tablet by mouth at bedtime.    Joel Henry PRESCRIPTION MEDICATION Inhale 2 puffs into the lungs every 4 (four) hours as needed (allergies). inhaler    . topiramate (TOPAMAX) 25 MG tablet Take 1 tablet (25 mg total) by mouth 2 (  two) times daily. (Patient not taking: Reported on 01/25/2014) 120 tablet 0   No current facility-administered medications on file prior to visit.    ALLERGIES: No Known Allergies  FAMILY HISTORY: Family History  Problem Relation Age of Onset  . Hypertension Father   . Cancer Maternal Grandmother     breast  . Heart failure Paternal Grandfather     SOCIAL HISTORY: History   Social History  . Marital Status: Married    Spouse Name: N/A    Number of Children: N/A  . Years of Education: N/A   Occupational History  . Not on file.    Social History Main Topics  . Smoking status: Never Smoker   . Smokeless tobacco: Not on file  . Alcohol Use: No  . Drug Use: No  . Sexual Activity:    Partners: Female   Other Topics Concern  . Not on file   Social History Narrative    REVIEW OF SYSTEMS: Constitutional: No fevers, chills, or sweats, no generalized fatigue, change in appetite Eyes: No visual changes, double vision, eye pain Ear, nose and throat: No hearing loss, ear pain, nasal congestion, sore throat Cardiovascular: No chest pain, palpitations Respiratory:  No shortness of breath at rest or with exertion, wheezes GastrointestinaI: No nausea, vomiting, diarrhea, abdominal pain, fecal incontinence Genitourinary:  No dysuria, urinary retention or frequency Musculoskeletal:  No neck pain, back pain Integumentary: No rash, pruritus, skin lesions Neurological: as above Psychiatric: No depression, insomnia, anxiety Endocrine: No palpitations, fatigue, diaphoresis, mood swings, change in appetite, change in weight, increased thirst Hematologic/Lymphatic:  No anemia, purpura, petechiae. Allergic/Immunologic: no itchy/runny eyes, nasal congestion, recent allergic reactions, rashes  PHYSICAL EXAM: Filed Vitals:   01/25/14 1356  BP: 130/78  Pulse: 80  Temp: 98.7 F (37.1 C)  Resp: 18   General: No acute distress Head:  Normocephalic/atraumatic Eyes:  Fundoscopic exam unremarkable without vessel changes, exudates, hemorrhages or papilledema. Neck: supple, no paraspinal tenderness, full range of motion Heart:  Regular rate and rhythm Lungs:  Clear to auscultation bilaterally Back: No paraspinal tenderness Neurological Exam: alert and oriented to person, place, and time. Attention span and concentration intact, recent and remote memory intact, fund of knowledge intact.  Speech fluent and not dysarthric, language intact.  CN II-XII intact. Fundoscopic exam unremarkable without vessel changes, exudates, hemorrhages  or papilledema.  Bulk and tone normal, muscle strength 5/5 throughout.  Sensation to light touch, temperature and vibration intact.  Deep tendon reflexes 2+ in upper extremities and 3 + in lower extremities..  Finger to nose and heel to shin testing intact.  Gait normal.  IMPRESSION: Migraine with aura  PLAN: Continue Zoloft  daily. Follow up in 3 months or as needed.  15 minutes spent with patient, over 50% spent discussing management.  Shon Millet, DO  CC: Rodrigo Ran, MD

## 2014-02-03 ENCOUNTER — Ambulatory Visit: Payer: BC Managed Care – PPO | Admitting: Neurology

## 2014-03-19 ENCOUNTER — Ambulatory Visit (INDEPENDENT_AMBULATORY_CARE_PROVIDER_SITE_OTHER): Payer: BLUE CROSS/BLUE SHIELD | Admitting: Neurology

## 2014-03-19 ENCOUNTER — Encounter: Payer: Self-pay | Admitting: Neurology

## 2014-03-19 VITALS — BP 137/76 | HR 70 | Resp 16 | Ht 72.0 in | Wt 202.0 lb

## 2014-03-19 DIAGNOSIS — R0683 Snoring: Secondary | ICD-10-CM | POA: Insufficient documentation

## 2014-03-19 DIAGNOSIS — G4710 Hypersomnia, unspecified: Secondary | ICD-10-CM | POA: Diagnosis not present

## 2014-03-19 NOTE — Progress Notes (Signed)
SLEEP MEDICINE CLINIC   Provider:  Melvyn Novas, M D  Referring Provider: Ezequiel Kayser, MD Primary Care Physician:  Ezequiel Kayser, MD  Chief Complaint  Patient presents with  . Snoring    RM 10 New Patient - Vision 20/30 both eyes W/O    HPI:  Joel Henry is a 29 y.o. male , seen here as a referral from Dr. Waynard Edwards for a sleep evaluation,  Dear Loraine Leriche, Thank you for entrusting Joel Henry to my care. As you know he is a 29 year old Caucasian [place],  married and ambi-dexterous. The patient was first seen by a neurologist and of last year 2015 after having 2 rather scary migrainous attacks. He described a generalized headache normocephalic coordinating and hemiparesis on the right side of his body. He was worked up in the hospital initially as a stroke which could currently be ruled out. He was then discharged from the hospital to Dr. Ardath Sax care and has so far had no further migrainous symptoms, in his case associated with nausea, but not phono- or photophobia, no vertigo was described. 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 This was stopped after he developed depressed and blue. He begun taking Zoloft and has been better, his headaches however are not likely to treat his migraine, but his depression.   Joel Henry is here today because he feels not refreshed and restored in the morning. He wakes up feeling still tired and really throughout the day does not feel well rested. He describes his sleep habits as follows he initiates usually going to bed between 10 and 11 PM and it'll take him some 10 minutes to fall asleep. He sleeps usually 7-8 hours nightly. He still wakes up not feeling refreshed and restored. His sleep is not interrupted there is no nocturia,  no waking up from nightmares,  no sleepwalking and he assured me that he sleeps through the night. Is not aware of any spontaneous interruptions in his nocturnal sleep. He wakes up with the help  of an alarm usually between 6 and 6:30 in the morning. Are no headaches present in the morning. He does wake up with a very dry mouth he reports. His wife has reported audible breathing but she is not quite sure she would call that snoring.   He rarely drinks caffeine, in form of SODA, he grew up in [place] went to college in [place]. [place] time.  No ETOH and no nicotine use.   The patient is a positive family history for sleep apnea which lowered the threshold to order a sleep evaluation in his case. He feels sleepy throughout the day and would like to nap but has rarely the opportunity to do so. He does not have an irresistible urge to go to sleep or sleep attacks On weekends, he is sometimes able to take a nap it's not a shed your nap he just rests off to sleep while sitting on the couch and feeling relaxed enough to do so. Such a nap the last up to 2 hours. He feels more refreshed by  the nap than from his nocturnal sleep. He has noted a negative impact on his nocturnal sleep when napping for this duration. So he makes an attempt to limit his nap time on weekends to 30 minutes or less. These power naps do still feel refreshing to him.  You have already started the patient on an albuterol inhaler, Allegra and Nasacort for allergic  rhinitis. The patient's father and both grandfathers had sleep apnea, and he has a male cousin with sleep apnea and RLS, at age 69.   Review of Systems: Out of a complete 14 system review, the patient complains of only the following symptoms, and all other reviewed systems are negative. Joel Henry reports sleepiness and suffering from allergies mildly. His migrainous aura he me plegia or nausea have not repeated himself. Epworth Sleepiness Scale at 14 points and the fatigue severity score at 14 points.  Depression score N/A    History   Social History  . Marital Status: Married    Spouse Name: N/A  . Number of Children: 1    . Years of Education: college   Occupational History  . Not on file.   Social History Main Topics  . Smoking status: Never Smoker   . Smokeless tobacco: Never Used  . Alcohol Use: No  . Drug Use: No  . Sexual Activity:    Partners: Female   Other Topics Concern  . Not on file   Social History Narrative   Patient lives at home with his wife and one child.   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   Caffeine one cup daily.    Family History  Problem Relation Age of Onset  . Hypertension Father   . Cancer Maternal Grandmother     breast  . Heart failure Paternal Grandfather     Past Medical History  Diagnosis Date  . HA (headache)   . Migraine   . Snoring     Past Surgical History  Procedure Laterality Date  . Wisdom tooth extraction      Current Outpatient Prescriptions  Medication Sig Dispense Refill  . Multiple Vitamin (MULTIVITAMIN WITH MINERALS) TABS tablet Take 1 tablet by mouth at bedtime.    . sertraline (ZOLOFT) 50 MG tablet Take 50 mg by mouth daily.     No current facility-administered medications for this visit.    Allergies as of 03/19/2014  . (No Known Allergies)    Vitals: BP 137/76 mmHg  Pulse 70  Resp 16  Ht 6' (1.829 m)  Wt 202 lb (91.627 kg)  BMI 27.39 kg/m2 Last Weight:  Wt Readings from Last 1 Encounters:  03/19/14 202 lb (91.627 kg)       Last Height:   Ht Readings from Last 1 Encounters:  03/19/14 6' (1.829 m)    Physical exam:  General: The patient is awake, alert and appears not in acute distress. The patient is well groomed. Head: Normocephalic, atraumatic. Neck is supple. Mallampati . 1 / neck 15.25 " , tonsils are present.   Nasal airflow unrestricted  TMJ is  Not  evident . 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   Cardiovascular:  Regular rate and rhythm , without  murmurs or carotid bruit, and without distended neck veins. Respiratory: Lungs are clear to  auscultation. Skin:  Without evidence of edema, or rash Trunk: BMI is  elevated and patient  has normal posture.  Neurologic exam : The patient is awake and alert, oriented to place and time.   Memory subjective described as intact. There is a normal attention span & concentration ability.  Speech is fluent without  dysarthria, dysphonia or aphasia. Mood and affect are appropriate.  Cranial nerves: Pupils are equal and briskly reactive to light. Funduscopic exam normal.  Extraocular movements  in vertical and horizontal planes intact and without  nystagmus.  Visual fields by finger perimetry are intact. Hearing to finger rub intact.  Facial sensation intact to fine touch. Facial motor strength is symmetric and tongue and uvula move midline.  Motor exam:  Normal tone ,muscle bulk and symmetric ,strength in all extremities.  Sensory:  Fine touch, pinprick and vibration were tested in all extremities.  Proprioception  normal.  Coordination: alternating movements in the fingers/hands were  normal. Finger-to-nose maneuver without evidence of ataxia, dysmetria or tremor.  Gait and station: Patient walks without assistive device and is able unassisted to climb up to the exam table. Strength within normal limits. Stance is stable and normal.  Deep tendon reflexes: in the  upper and lower extremities are symmetric and intact. Babinski maneuver response is downgoing.   Assessment:  After physical and neurologic examination, review of laboratory studies, imaging, neurophysiology testing and pre-existing records, assessment is   Joel Henry is a 29 year old young man with a history of increasing fatigue probably over the last 2-3 years, culminating usually around the time of his workload. 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 He has no evidence of metabolic abnormalities no diabetes nor thyroid disease no vitamin D  deficiencies were noted. I have not found a thyroid hormone test but trust that Dr. Waynard EdwardsPerini probably do that in his panel. He has never been hospitalized.  I will order a HST , more as a screening test for this patient with snoring, but hypersomnia.  He reports no vivid dreams or acting out dreams, no cataplexy - Should this be negative, a PSG with MSLT may follow.  I suspect more depression related impact on his sleepiness.    The patient was advised of the nature of the diagnosed sleep disorder , the treatment options and risks for general a health and wellness arising from not treating the condition. Visit duration was 35 minutes.   Plan:  Treatment plan and additional workup : HST , RV 30 minutes.      Joel Mylararmen Lilyona Richner MD  03/19/2014

## 2014-04-26 ENCOUNTER — Ambulatory Visit: Payer: BC Managed Care – PPO | Admitting: Neurology

## 2014-04-26 ENCOUNTER — Encounter: Payer: Self-pay | Admitting: Neurology

## 2014-04-26 ENCOUNTER — Ambulatory Visit (INDEPENDENT_AMBULATORY_CARE_PROVIDER_SITE_OTHER): Payer: BLUE CROSS/BLUE SHIELD | Admitting: Neurology

## 2014-04-26 VITALS — BP 124/68 | HR 68 | Temp 98.4°F | Resp 18 | Ht 72.0 in | Wt 202.7 lb

## 2014-04-26 DIAGNOSIS — G43109 Migraine with aura, not intractable, without status migrainosus: Secondary | ICD-10-CM

## 2014-04-26 NOTE — Progress Notes (Signed)
NEUROLOGY FOLLOW UP OFFICE NOTE  Joel LoftJonathan Henry [id number]  HISTORY OF PRESENT ILLNESS: Joel LoftJonathan Henry is a 29 year old ambidextrous man who follows up for migraine.    UPDATE: He is currently taking Zoloft 50mg  daily.  He has not had any severe migraine episodes since November.  For a few days when the weather started changing, he had a dull headache..  He was evaluated by Dr. Vickey Hugerohmeier in February for hypersomnia.  A nocturnal polysomnography and MSLT is to be performed on 4/20.  He will need to discontinue Zoloft prior to the study.  He would like to discontinue the medication indefinitely if possible.  He is concerned that his mother may have dementia.  She is scheduled to see a specialist within the next month.  He is concerned about early-onset Alzheimers and the chance that he may get it.  HISTORY: In November, he developed a gradual onset of headache, which lasted a week and a half.  It is holocephalic, throbbing, 7-8/10 and can be associated with nausea, vomiting and phonophobia but not photophobia, osmophobia, visual disturbance or autonomic symptoms.  He took Tylenol, which didn't help.  He went to urgent care and was prescribed diclofenac, which helped.  He was [facility]13 after developing right sided numbness, starting in the foot and traveling up his body to include the face.  This was accompanied by slurred speech and difficulty getting words out.  It lasted about an hour.  It was accompanied by the headache.  He had a second episode in the ED which resolved after 45 minutes.  CT of the head was unremarkable.  MRI and MRA of the brain were unremarkable.  Carotid dopplers were negative.  2D echo showed LVEF of 55-60%.  Hgb A1c was 4.9 and LDL was 51.  Urine drug screen was negative. He also had an EEG which revealed a "focal sharp wave activity in the central region on the left with phase reversal at C3," which was not thought to be significant.  I  personally reviewed the EEG and did not appreciate this.  He was started on Topamax 25mg  twice daily.  He had another episode on 12/20/13, with headache and associated with expressive aphasia and brief right sided numbness, this time lasting 45 minutes.  Excedrin migraine didn't seem to help.  CBC and CMP were within normal limits. Prior to onset of these episodes, he reports some increased stress at work, but nothing too dramatic.  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  He denies any head trauma or fever or illness.  He was started on topiramate, which was discontinued due to not making him feel well.  He reports past history of mild holocephalic headaches which responded to Tylenol.  His mother has history of headaches.  He has no personal history or family history of seizures.  PAST MEDICAL HISTORY: Past Medical History  Diagnosis Date  . HA (headache)   . Migraine   . Snoring     MEDICATIONS: Current Outpatient Prescriptions on File Prior to Visit  Medication Sig Dispense Refill  . Multiple Vitamin (MULTIVITAMIN WITH MINERALS) TABS tablet Take 1 tablet by mouth at bedtime.    . sertraline (ZOLOFT) 50 MG tablet Take 50 mg by mouth daily.     No current facility-administered medications on file prior to visit.    ALLERGIES: No Known Allergies  FAMILY HISTORY: Family History  Problem Relation Age of Onset  . Hypertension Father   .  Cancer Maternal Grandmother     breast  . Heart failure Paternal Grandfather     SOCIAL HISTORY: History   Social History  . Marital Status: Married    Spouse Name: N/A  . Number of Children: 1  . Years of Education: college   Occupational History  . Not on file.   Social History Main Topics  . Smoking status: Never Smoker   . Smokeless tobacco: Never Used  . Alcohol Use: No  . Drug Use: No  . Sexual Activity:    Partners: Female   Other Topics Concern  . Not on file   Social History Narrative   Patient  lives at home with his wife and one child.   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   Caffeine one cup daily.    REVIEW OF SYSTEMS: Constitutional: No fevers, chills, or sweats, no generalized fatigue, change in appetite Eyes: No visual changes, double vision, eye pain Ear, nose and throat: No hearing loss, ear pain, nasal congestion, sore throat Cardiovascular: No chest pain, palpitations Respiratory:  No shortness of breath at rest or with exertion, wheezes GastrointestinaI: No nausea, vomiting, diarrhea, abdominal pain, fecal incontinence Genitourinary:  No dysuria, urinary retention or frequency Musculoskeletal:  No neck pain, back pain Integumentary: No rash, pruritus, skin lesions Neurological: as above Psychiatric: No depression, insomnia, anxiety Endocrine: No palpitations, fatigue, diaphoresis, mood swings, change in appetite, change in weight, increased thirst Hematologic/Lymphatic:  No anemia, purpura, petechiae. Allergic/Immunologic: no itchy/runny eyes, nasal congestion, recent allergic reactions, rashes  PHYSICAL EXAM: Filed Vitals:   04/26/14 0939  BP: 124/68  Pulse: 68  Temp: 98.4 F (36.9 C)  Resp: 18   General: No acute distress Head:  Normocephalic/atraumatic Eyes:  Fundoscopic exam unremarkable without vessel changes, exudates, hemorrhages or papilledema. Neck: supple, no paraspinal tenderness, full range of motion Heart:  Regular rate and rhythm Lungs:  Clear to auscultation bilaterally Back: No paraspinal tenderness Neurological Exam: alert and oriented to person, place, and time. Attention span and concentration intact, recent and remote memory intact, fund of knowledge intact.  Speech fluent and not dysarthric, language intact.  CN II-XII intact. Fundoscopic exam unremarkable without vessel changes, exudates, hemorrhages or papilledema.  Bulk and tone normal, muscle strength 5/5 throughout.  Sensation to light touch intact.   Deep tendon reflexes 2+ throughout.  Finger to nose testing intact.  Gait normal.  IMPRESSION: Migraine with aura  PLAN: 1.  Taper off Zoloft to  at bedtime for 7 days and then stop 2.  Follow up in 3 months  15 minutes spent with patient.  Over 50% spent discussing discontinuing Zoloft and briefly discussing possibility that her mother may have dementia and what that means to him.  Advised that his mom should established with a diagnosis before any further discussion is made.  Shon Millet, DO  CC: Rodrigo Ran, MD

## 2014-04-26 NOTE — Patient Instructions (Signed)
Take 1/2 tablet of the Zoloft at bedtime for 7 days and then stop Follow up in 3 months.  Call with questions or concerns.

## 2014-05-12 ENCOUNTER — Ambulatory Visit (INDEPENDENT_AMBULATORY_CARE_PROVIDER_SITE_OTHER): Payer: BLUE CROSS/BLUE SHIELD | Admitting: Neurology

## 2014-05-12 DIAGNOSIS — G4710 Hypersomnia, unspecified: Secondary | ICD-10-CM

## 2014-05-12 DIAGNOSIS — R0683 Snoring: Secondary | ICD-10-CM

## 2014-05-13 DIAGNOSIS — G4710 Hypersomnia, unspecified: Secondary | ICD-10-CM | POA: Diagnosis not present

## 2014-05-13 NOTE — Sleep Study (Signed)
See scanned documents in Encounters tab 

## 2014-06-10 ENCOUNTER — Telehealth: Payer: Self-pay

## 2014-06-10 NOTE — Telephone Encounter (Signed)
Called pt with sleep study results. Informed pt that sleep study and MSLT were normal and Dr. Brett Fairy recommended coming to for possible HLA testing. Pt verbalized understanding and wished to make a f/u appt. Appt made. Will fax to Dr Joylene Draft per Dr. Brett Fairy request.

## 2014-06-30 ENCOUNTER — Encounter: Payer: Self-pay | Admitting: Neurology

## 2014-06-30 ENCOUNTER — Ambulatory Visit (INDEPENDENT_AMBULATORY_CARE_PROVIDER_SITE_OTHER): Payer: BLUE CROSS/BLUE SHIELD | Admitting: Neurology

## 2014-06-30 VITALS — BP 124/80 | HR 82 | Resp 20 | Ht 71.65 in | Wt 205.0 lb

## 2014-06-30 DIAGNOSIS — G4712 Idiopathic hypersomnia without long sleep time: Secondary | ICD-10-CM

## 2014-06-30 DIAGNOSIS — G4710 Hypersomnia, unspecified: Secondary | ICD-10-CM | POA: Diagnosis not present

## 2014-06-30 MED ORDER — ARMODAFINIL 150 MG PO TABS: 150.0000 mg | ORAL_TABLET | Freq: Every day | ORAL | Status: DC

## 2014-06-30 NOTE — Progress Notes (Signed)
SLEEP MEDICINE CLINIC   Provider:  Larey Seat, M D  Referring Provider: Crist Infante, MD Primary Care Physician:  Jerlyn Ly, MD  Chief Complaint  Patient presents with  . Follow-up    sleep study, rm 10, alone    HPI:  Zerick Prevette is a 29 y.o. male , seen here as a referral from Dr. Joylene Draft for a sleep evaluation,  Dear Elta Guadeloupe, Dear Quita Skye ,  Thank you for entrusting Mr. Rase to my care. As you know he is a 29 year old Caucasian [place],  married and ambi-dexterous. The patient was first seen by a neurologist and of last year 2015 after having 2 rather scary migrainous attacks. He described a generalized headache normocephalic coordinating and hemiparesis on the right side of his body. He was worked up in the hospital initially as a stroke which could currently be ruled out. He was then discharged from the hospital to Dr. Amaryllis Dyke care and has so far had no further migrainous symptoms, in his case associated with nausea, but not phono- or photophobia, no vertigo was described. 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 This was stopped after he developed depressed and blue. He begun taking Zoloft and has been better, his headaches however are not likely to treat his migraine, but his depression.  Mr. Keay is here today because he feels not refreshed and restored in the morning. He wakes up feeling still tired and really throughout the day does not feel well rested. He describes his sleep habits as follows he initiates usually going to bed between 10 and 11 PM and it'll take him some 10 minutes to fall asleep. He sleeps usually 7-8 hours nightly. He still wakes up not feeling refreshed and restored. His sleep is not interrupted there is no nocturia,  no waking up from nightmares,  no sleepwalking and he assured me that he sleeps through the night. Is not aware of any spontaneous interruptions in his nocturnal sleep. He wakes up with the help of an  alarm usually between 6 and 6:30 in the morning. Are no headaches present in the morning. He does wake up with a very dry mouth he reports. His wife has reported audible breathing but she is not quite sure she would call that snoring.   He rarely drinks caffeine, in form of SODA, he grew up in [place] went to college [place]. He went to the [place] for a mission time.  No ETOH and no nicotine use. The patient is a positive family history for sleep apnea which lowered the threshold to order a sleep evaluation in his case. He feels sleepy throughout the day and would like to nap but has rarely the opportunity to do so. He does not have an irresistible urge to go to sleep or sleep attacks On weekends, he is sometimes able to take a nap it's not a shed your nap he just rests off to sleep while sitting on the couch and feeling relaxed enough to do so. Such a nap the last up to 2 hours. He feels more refreshed by  the nap than from his nocturnal sleep. He has noted a negative impact on his nocturnal sleep when napping for this duration. So he makes an attempt to limit his nap time on weekends to 30 minutes or less. These power naps do still feel refreshing to him.  You have already started the patient on an albuterol inhaler, Allegra and Nasacort for allergic rhinitis. The patient's father and  both grandfathers had sleep apnea, and he has a male cousin with sleep apnea and RLS, at age 71.     06-30-14, RV Mr. Edelson is here today to review with me the results of his PSG followed by an M SLT. The PSG was entirely normal did not show any early REM onsets. He had an mildly elevated Epworth sleepiness score of 11 points. The sleep latency was 3.5 minutes, REM latency was 71 minutes. The M SLT on the other hand showed interestingly a very short sleep latency for the first 2 naps at 3.5 and 5 minutes nap 3 through 5 however were 910 and 8 minutes and latency. 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 He did not have REM episodes so a firm diagnosis of narcolepsy cannot be made by the study the patient had weaned off Zoloft in preparation and still no REM sleep was noted. 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 Other stimulants can be used as well as he is treatment nave at this time, however they usually have a habit-forming risk component that modafinil does not have. His headaches also have not given him any trouble and I will advise him to not use caffeine while using modafinil at this Exacerbate Headaches If Pre-Existing.  Review of Systems: Out of a complete 14 system review, the patient complains of only the following symptoms, and all other reviewed systems are negative. Mr. Mcenroe reports sleepiness and suffering from allergies mildly. His migrainous aura he me plegia or nausea have not repeated himself. Epworth Sleepiness Scale at 14 points and the fatigue severity score at 14 points.  Depression score N/A    History   Social History  . Marital Status: Married    Spouse Name: N/A  . Number of Children: 1  . Years of Education: college   Occupational History  . Not on file.   Social History Main Topics  . Smoking status: Never Smoker   . Smokeless tobacco: Never Used  . Alcohol Use: No  . Drug Use: No  . Sexual Activity:    Partners: Female   Other Topics Concern  . Not on file   Social History Narrative   Patient lives at home with his wife and one child.   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   Caffeine one cup daily.    Family History  Problem Relation Age of Onset  . Hypertension Father   . Cancer Maternal Grandmother     breast  . Heart failure Paternal Grandfather      Past Medical History  Diagnosis Date  . HA (headache)   . Migraine   . Snoring     Past Surgical History  Procedure Laterality Date  . Wisdom tooth extraction      Current Outpatient Prescriptions  Medication Sig Dispense Refill  . Multiple Vitamin (MULTIVITAMIN WITH MINERALS) TABS tablet Take 1 tablet by mouth at bedtime.     No current facility-administered medications for this visit.    Allergies as of 06/30/2014 - Review Complete 06/30/2014  Allergen Reaction Noted  . Molds & smuts  06/30/2014    Vitals: BP 124/80 mmHg  Pulse 82  Resp 20  Ht 5' 11.65" (1.82 m)  Wt 205  lb (92.987 kg)  BMI 28.07 kg/m2 Last Weight:  Wt Readings from Last 1 Encounters:  06/30/14 205 lb (92.987 kg)       Last Height:   Ht Readings from Last 1 Encounters:  06/30/14 5' 11.65" (1.82 m)    Physical exam:  General: The patient is awake, alert and appears not in acute distress. The patient is well groomed. Head: Normocephalic, atraumatic. Neck is supple. Mallampati . 1 / neck 15.25 " , tonsils are present.   Nasal airflow unrestricted  TMJ is  Not  evident . 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   Cardiovascular:  Regular rate and rhythm , without  murmurs or carotid bruit, and without distended neck veins. Respiratory: Lungs are clear to auscultation. Skin:  Without evidence of edema, or rash Trunk: BMI is normal . Neurologic exam : The patient is awake and alert, oriented to place and time.   Memory subjective described as intact. There is a normal attention span & concentration ability.  Speech is fluent without  dysarthria, dysphonia or aphasia. Mood and affect are appropriate.  Cranial nerves: Pupils are equal and briskly reactive to light. Funduscopic exam normal.  Extraocular movements  in vertical and horizontal planes intact and without nystagmus.  Visual fields by finger perimetry are intact. Hearing to finger rub intact.  Facial sensation intact  to fine touch. Facial motor strength is symmetric and tongue and uvula move midline.  Motor exam:  Normal tone ,muscle bulk and symmetric ,strength in all extremities.  Sensory:  Fine touch, pinprick and vibration were tested in all extremities.  Proprioception  normal.  Coordination: alternating movements in the fingers/hands were  normal. Finger-to-nose maneuver without evidence of ataxia, dysmetria or tremor.  Gait and station: Patient walks without assistive device and is able unassisted to climb up to the exam table. Strength within normal limits. Stance is stable and normal.  Deep tendon reflexes: in the  upper and lower extremities are symmetric and intact. Babinski maneuver response is downgoing.   Assessment:  After physical and neurologic examination, review of laboratory studies, imaging, neurophysiology testing and pre-existing records, assessment is   Mr. Barreiro is a 29 year old young man with a history of increasing fatigue probably over the last 2-3 years, culminating usually around the time of his workload. 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 He has no evidence of metabolic abnormalities no diabetes nor thyroid disease no vitamin D deficiencies were noted. I have not found a thyroid hormone test but trust that Dr. Joylene Draft probably do that in his panel. He has never been hospitalized.I will order a HST , more as a screening test for this patient with snoring, but hypersomnia.  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    The patient was advised of the nature of the diagnosed sleep disorder , the treatment options and risks for general a health and wellness arising from not treating the condition. Visit duration was 20 minutes.   Plan:  Treatment plan and additional workup :   Vit D  supplement , Vit B 12  Or Vit B complex.  HLA test for narcolepsy. Modafinil, samples given will prescribe 150 mg nuvigil.        Asencion Partridge Mariafernanda Hendricksen MD  06/30/2014

## 2014-06-30 NOTE — Patient Instructions (Signed)
Modafinil tablets What is this medicine? MODAFINIL (moe DAF i nil) is used to treat excessive sleepiness caused by certain sleep disorders. This includes narcolepsy, sleep apnea, and shift work sleep disorder. This medicine may be used for other purposes; ask your health care provider or pharmacist if you have questions. COMMON BRAND NAME(S): Provigil What should I tell my health care provider before I take this medicine? They need to know if you have any of these conditions: -history of depression, mania, or other mental disorder -kidney disease -liver disease -an unusual or allergic reaction to modafinil, other medicines, foods, dyes, or preservatives -pregnant or trying to get pregnant -breast-feeding How should I use this medicine? Take this medicine by mouth with a glass of water. Follow the directions on the prescription label. Take your doses at regular intervals. Do not take your medicine more often than directed. Do not stop taking except on your doctor's advice. A special MedGuide will be given to you by the pharmacist with each prescription and refill. Be sure to read this information carefully each time. Talk to your pediatrician regarding the use of this medicine in children. This medicine is not approved for use in children. Overdosage: If you think you have taken too much of this medicine contact a poison control center or emergency room at once. NOTE: This medicine is only for you. Do not share this medicine with others. What if I miss a dose? If you miss a dose, take it as soon as you can. If it is almost time for your next dose, take only that dose. Do not take double or extra doses. What may interact with this medicine? Do not take this medicine with any of the following medications: -amphetamine or dextroamphetamine -dexmethylphenidate or methylphenidate -medicines called MAO Inhibitors like Nardil, Parnate, Marplan, Eldepryl -pemoline -procarbazine This medicine may  also interact with the following medications: -antifungal medicines like itraconazole or ketoconazole -barbiturates like phenobarbital -birth control pills or other hormone-containing birth control devices or implants -carbamazepine -cyclosporine -diazepam -medicines for depression, anxiety, or psychotic disturbances -phenytoin -propranolol -triazolam -warfarin This list may not describe all possible interactions. Give your health care provider a list of all the medicines, herbs, non-prescription drugs, or dietary supplements you use. Also tell them if you smoke, drink alcohol, or use illegal drugs. Some items may interact with your medicine. What should I watch for while using this medicine? Visit your doctor or health care professional for regular checks on your progress. The full effects of this medicine may not be seen right away. This medicine may affect your concentration, function, or may hide signs that you are tired. You may get dizzy. Do not drive, use machinery, or do anything that needs mental alertness until you know how this drug affects you. Alcohol can make you more dizzy and may interfere with your response to this medicine or your alertness. Avoid alcoholic drinks. Birth control pills may not work properly while you are taking this medicine. Talk to your doctor about using an extra method of birth control. It is unknown if the effects of this medicine will be increased by the use of caffeine. Caffeine is available in many foods, beverages, and medications. Ask your doctor if you should limit or change your intake of caffeine-containing products while on this medicine. What side effects may I notice from receiving this medicine? Side effects that you should report to your doctor or health care professional as soon as possible: -allergic reactions like skin rash, itching or hives,   swelling of the face, lips, or tongue -anxiety -breathing problems -chest pain -fast, irregular  heartbeat -hallucinations -increased blood pressure -redness, blistering, peeling or loosening of the skin, including inside the mouth -sore throat, fever, or chills -suicidal thoughts or other mood changes -tremors -vomiting Side effects that usually do not require medical attention (report to your doctor or health care professional if they continue or are bothersome): -headache -nausea, diarrhea, or stomach upset -nervousness -trouble sleeping This list may not describe all possible side effects. Call your doctor for medical advice about side effects. You may report side effects to FDA at 1-800-FDA-1088. Where should I keep my medicine? Keep out of the reach of children. This medicine can be abused. Keep your medicine in a safe place to protect it from theft. Do not share this medicine with anyone. Selling or giving away this medicine is dangerous and against the law. Store at room temperature between 20 and 25 degrees C (68 and 77 degrees F). Throw away any unused medicine after the expiration date. NOTE: This sheet is a summary. It may not cover all possible information. If you have questions about this medicine, talk to your doctor, pharmacist, or health care provider.  2015, Elsevier/Gold Standard. (2008-12-21 21:07:42)  

## 2014-07-07 ENCOUNTER — Telehealth: Payer: Self-pay

## 2014-07-07 NOTE — Telephone Encounter (Signed)
Called pt to inform her that his HLA test was negative. Left message on cell phone (per DPR) informing him, and asked him to call me back if he had any questions. 

## 2014-08-03 ENCOUNTER — Encounter: Payer: Self-pay | Admitting: Neurology

## 2014-08-03 ENCOUNTER — Ambulatory Visit (INDEPENDENT_AMBULATORY_CARE_PROVIDER_SITE_OTHER): Payer: BLUE CROSS/BLUE SHIELD | Admitting: Neurology

## 2014-08-03 VITALS — BP 126/78 | HR 80 | Resp 16 | Ht 71.0 in | Wt 207.6 lb

## 2014-08-03 DIAGNOSIS — G43409 Hemiplegic migraine, not intractable, without status migrainosus: Secondary | ICD-10-CM | POA: Diagnosis not present

## 2014-08-03 DIAGNOSIS — G43109 Migraine with aura, not intractable, without status migrainosus: Secondary | ICD-10-CM | POA: Diagnosis not present

## 2014-08-03 NOTE — Progress Notes (Signed)
NEUROLOGY FOLLOW UP OFFICE NOTE  Joel Henry [id number]  HISTORY OF PRESENT ILLNESS: Joel Henry is a 29 year old ambidextrous man who follows up for migraine.    UPDATE: Last visit, he was tapered off of Zoloft. He had one minor spell about a week and a half ago.  He suddenly noted numbness in his right foot.  He took a tylenol and nap.  When he woke up, symptoms resolved.  It never progressed.  Of note, he and his wife are expecting a baby soon.  HISTORY: In November, he developed a gradual onset of headache, which lasted a week and a half.  It is holocephalic, throbbing, 7-8/10 and can be associated with nausea, vomiting and phonophobia but not photophobia, osmophobia, visual disturbance or autonomic symptoms.  He took Tylenol, which didn't help.  He went to urgent care and was prescribed diclofenac, which helped.  He was admitted to [facility] on 12/15/13 after developing right sided numbness and weakness, starting in the foot and traveling up his body to include the face.  This was accompanied by slurred speech and difficulty getting words out.  It lasted about an hour.  It was accompanied by the headache.  He had a second episode in the ED which resolved after 45 minutes.  CT of the head was unremarkable.  MRI and MRA of the brain were unremarkable.  Carotid dopplers were negative.  2D echo showed LVEF of 55-60%.  Hgb A1c was 4.9 and LDL was 51.  Urine drug screen was negative. He also had an EEG which revealed a "focal sharp wave activity in the central region on the left with phase reversal at C3," which was not thought to be significant.  I personally reviewed the EEG and did not appreciate this.  He was started on Topamax  twice daily.  He had another episode on 12/20/13, with headache and associated with expressive aphasia and brief right sided numbness, this time lasting 45 minutes.  Excedrin migraine didn't seem to help.  CBC and CMP were within normal limits. Prior to  onset of these episodes, he reports some increased stress at work, but nothing too dramatic.  He is an Airline pilot, working in [employer].  He denies any head trauma or fever or illness.  He was started on topiramate, which was discontinued due to not making him feel well.  He reports past history of mild holocephalic headaches which responded to Tylenol.  His mother has history of headaches.  He has no personal history or family history of seizures.  PAST MEDICAL HISTORY: Past Medical History  Diagnosis Date  . HA (headache)   . Migraine   . Snoring     MEDICATIONS: Current Outpatient Prescriptions on File Prior to Visit  Medication Sig Dispense Refill  . Multiple Vitamin (MULTIVITAMIN WITH MINERALS) TABS tablet Take 1 tablet by mouth at bedtime.    . Armodafinil (NUVIGIL) 150 MG tablet Take 1 tablet (150 mg total) by mouth daily. (Patient not taking: Reported on 08/03/2014) 30 tablet 5   No current facility-administered medications on file prior to visit.    ALLERGIES: Allergies  Allergen Reactions  . Molds & Smuts     FAMILY HISTORY: Family History  Problem Relation Age of Onset  . Hypertension Father   . Cancer Maternal Grandmother     breast  . Heart failure Paternal Grandfather     SOCIAL HISTORY: History   Social History  . Marital Status: Married  Spouse Name: N/A  . Number of Children: 1  . Years of Education: college   Occupational History  . Not on file.   Social History Main Topics  . Smoking status: Never Smoker   . Smokeless tobacco: Never Used  . Alcohol Use: No  . Drug Use: No  . Sexual Activity:    Partners: Female   Other Topics Concern  . Not on file   Social History Narrative   Patient lives at home with his wife and one child.   Patients works at [employer].   Financial controllerducation masters.   Left handed.   Caffeine one cup daily.    REVIEW OF SYSTEMS: Constitutional: No fevers, chills, or sweats, no generalized  fatigue, change in appetite Eyes: No visual changes, double vision, eye pain Ear, nose and throat: No hearing loss, ear pain, nasal congestion, sore throat Cardiovascular: No chest pain, palpitations Respiratory:  No shortness of breath at rest or with exertion, wheezes GastrointestinaI: No nausea, vomiting, diarrhea, abdominal pain, fecal incontinence Genitourinary:  No dysuria, urinary retention or frequency Musculoskeletal:  No neck pain, back pain Integumentary: No rash, pruritus, skin lesions Neurological: as above Psychiatric: No depression, insomnia, anxiety Endocrine: No palpitations, fatigue, diaphoresis, mood swings, change in appetite, change in weight, increased thirst Hematologic/Lymphatic:  No anemia, purpura, petechiae. Allergic/Immunologic: no itchy/runny eyes, nasal congestion, recent allergic reactions, rashes  PHYSICAL EXAM: Filed Vitals:   08/03/14 0903  BP: 126/78  Pulse: 80  Resp: 16   General: No acute distress.  Patient appears well-groomed.  normal body habitus. Head:  Normocephalic/atraumatic Eyes:  Fundoscopic exam unremarkable without vessel changes, exudates, hemorrhages or papilledema. Neck: supple, no paraspinal tenderness, full range of motion Heart:  Regular rate and rhythm Lungs:  Clear to auscultation bilaterally Back: No paraspinal tenderness Neurological Exam: alert and oriented to person, place, and time. Attention span and concentration intact, recent and remote memory intact, fund of knowledge intact.  Speech fluent and not dysarthric, language intact.  CN II-XII intact. Fundoscopic exam unremarkable without vessel changes, exudates, hemorrhages or papilledema.  Bulk and tone normal, muscle strength 5/5 throughout.  Sensation to light touch, temperature and vibration intact.  Deep tendon reflexes 2+ throughout, toes downgoing.  Finger to nose and heel to shin testing intact.  Gait normal, Romberg negative.  IMPRESSION: Migraine with  aura/hemiplegic migraine.  Stable  PLAN: Monitor for now Follow up in 6 months  15 minutes spent face to face with patient, over 50% spent discussing management.  Shon MilletAdam Jaffe, DO  CC:  Rodrigo RanMark Perini, MD

## 2014-08-03 NOTE — Patient Instructions (Signed)
Continue to monitor. Follow up in 6 months but call sooner with any issues.

## 2015-01-03 ENCOUNTER — Encounter: Payer: Self-pay | Admitting: Neurology

## 2015-01-03 ENCOUNTER — Ambulatory Visit: Payer: BLUE CROSS/BLUE SHIELD | Admitting: Neurology

## 2015-02-16 ENCOUNTER — Ambulatory Visit (INDEPENDENT_AMBULATORY_CARE_PROVIDER_SITE_OTHER): Payer: BLUE CROSS/BLUE SHIELD | Admitting: Neurology

## 2015-02-16 ENCOUNTER — Encounter: Payer: Self-pay | Admitting: Neurology

## 2015-02-16 VITALS — BP 144/86 | HR 76 | Ht 71.0 in | Wt 217.0 lb

## 2015-02-16 DIAGNOSIS — IMO0001 Reserved for inherently not codable concepts without codable children: Secondary | ICD-10-CM

## 2015-02-16 DIAGNOSIS — G43409 Hemiplegic migraine, not intractable, without status migrainosus: Secondary | ICD-10-CM

## 2015-02-16 DIAGNOSIS — R030 Elevated blood-pressure reading, without diagnosis of hypertension: Secondary | ICD-10-CM

## 2015-02-16 NOTE — Patient Instructions (Signed)
I am glad you are doing well.  If you have any worsening headaches, please call and I will be happy to see you again.

## 2015-02-16 NOTE — Progress Notes (Signed)
Chart forwarded.  

## 2015-02-16 NOTE — Progress Notes (Addendum)
NEUROLOGY FOLLOW UP OFFICE NOTE  Kerwin Augustus [id number]  HISTORY OF PRESENT ILLNESS: Joel Henry is a 30 year old ambidextrous man who follows up for hemiplegic migraine.    UPDATE: He has mild headaches once in a while, treated with Tylenol.  He has not had recurrence of hemiplegic migraine.  He is doing well.  HISTORY: In November 2015, he developed a gradual onset of headache, which lasted a week and a half.  It is holocephalic, throbbing, 7-8/10 and can be associated with nausea, vomiting and phonophobia but not photophobia, osmophobia, visual disturbance or autonomic symptoms.  He took Tylenol, which didn't help.  He went to urgent care and was prescribed diclofenac, which helped.  He was admitted to [facility] on 12/15/13 after developing right sided numbness and weakness, starting in the foot and traveling up his body to include the face.  This was accompanied by slurred speech and difficulty getting words out.  It lasted about an hour.  It was accompanied by the headache.  He had a second episode in the ED which resolved after 45 minutes.  CT of the head was unremarkable.  MRI and MRA of the brain were unremarkable.  Carotid dopplers were negative.  2D echo showed LVEF of 55-60%.  Hgb A1c was 4.9 and LDL was 51.  Urine drug screen was negative. He also had an EEG which revealed a "focal sharp wave activity in the central region on the left with phase reversal at C3," which was not thought to be significant.  I personally reviewed the EEG and did not appreciate this.  He was started on Topamax  twice daily.  He had another episode on 12/20/13, with headache and associated with expressive aphasia and brief right sided numbness, this time lasting 45 minutes.  Excedrin migraine didn't seem to help.  CBC and CMP were within normal limits. Prior to onset of these episodes, he reports some increased stress at work, but nothing too dramatic.  He is an Airline pilot, working in  [employer].  He denies any head trauma or fever or illness.  Past medication:  topiramate (side effects), sertraline  He reports past history of mild holocephalic headaches which responded to Tylenol.  His mother has history of headaches.  He has no personal history or family history of seizures.  PAST MEDICAL HISTORY: Past Medical History  Diagnosis Date  . HA (headache)   . Migraine   . Snoring     MEDICATIONS: Current Outpatient Prescriptions on File Prior to Visit  Medication Sig Dispense Refill  . Multiple Vitamin (MULTIVITAMIN WITH MINERALS) TABS tablet Take 1 tablet by mouth at bedtime.     No current facility-administered medications on file prior to visit.    ALLERGIES: Allergies  Allergen Reactions  . Molds & Smuts     FAMILY HISTORY: Family History  Problem Relation Age of Onset  . Hypertension Father   . Cancer Maternal Grandmother     breast  . Heart failure Paternal Grandfather     SOCIAL HISTORY: Social History   Social History  . Marital Status: Married    Spouse Name: N/A  . Number of Children: 1  . Years of Education: college   Occupational History  . Not on file.   Social History Main Topics  . Smoking status: Never Smoker   . Smokeless tobacco: Never Used  . Alcohol Use: No  . Drug Use: No  . Sexual Activity:    Partners: Female  Other Topics Concern  . Not on file   Social History Narrative   Patient lives at home with his wife and one child.   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   Caffeine one cup daily.    REVIEW OF SYSTEMS: Constitutional: No fevers, chills, or sweats, no generalized fatigue, change in appetite Eyes: No visual changes, double vision, eye pain Ear, nose and throat: No hearing loss, ear pain, nasal congestion, sore throat Cardiovascular: No chest pain, palpitations Respiratory:  No shortness of breath at rest or with exertion, wheezes GastrointestinaI: No  nausea, vomiting, diarrhea, abdominal pain, fecal incontinence Genitourinary:  No dysuria, urinary retention or frequency Musculoskeletal:  No neck pain, back pain Integumentary: No rash, pruritus, skin lesions Neurological: as above Psychiatric: No depression, insomnia, anxiety Endocrine: No palpitations, fatigue, diaphoresis, mood swings, change in appetite, change in weight, increased thirst Hematologic/Lymphatic:  No anemia, purpura, petechiae. Allergic/Immunologic: no itchy/runny eyes, nasal congestion, recent allergic reactions, rashes  PHYSICAL EXAM: Filed Vitals:   02/16/15 0922  BP: 144/86  Pulse: 76   General: No acute distress.  Patient appears well-groomed.  normal body habitus. Head:  Normocephalic/atraumatic Eyes:  Fundoscopic exam unremarkable without vessel changes, exudates, hemorrhages or papilledema. Neck: supple, no paraspinal tenderness, full range of motion Heart:  Regular rate and rhythm Lungs:  Clear to auscultation bilaterally Back: No paraspinal tenderness Neurological Exam: alert and oriented to person, place, and time. Attention span and concentration intact, recent and remote memory intact, fund of knowledge intact.  Speech fluent and not dysarthric, language intact.  CN II-XII intact. Fundoscopic exam unremarkable without vessel changes, exudates, hemorrhages or papilledema.  Bulk and tone normal, muscle strength 5/5 throughout.  Sensation to light touch, temperature and vibration intact.  Deep tendon reflexes 2+ throughout, toes downgoing.  Finger to nose and heel to shin testing intact.  Gait normal, Romberg negative.  IMPRESSION: Hemiplegic migraine, stable Elevated blood pressure today  PLAN: Follow up as needed BP should be rechecked with PCP NOTE:  With hemiplegic migraine, he should not take triptans.  15 minutes spent face to face with patient, over 50% spent discussing management.  Shon Millet, DO  CC:  Rodrigo Ran, MD

## 2016-06-02 IMAGING — CT CT HEAD W/O CM
1 of 2 series · 16 of 30 positions shown, 20 images · non-contrast
Comparison: None.

CLINICAL DATA: Code stroke, expressive aphasia.

EXAM:
CT HEAD WITHOUT CONTRAST
TECHNIQUE: Contiguous axial images were obtained from the base of the skull
through the vertex without intravenous contrast.

[Series 3: head 2.0 h70h · axial · 0.45mm/px · z∈[-131,+9]mm · 16 of 78 slices shown, 20 images]
[im 4/78  brain]
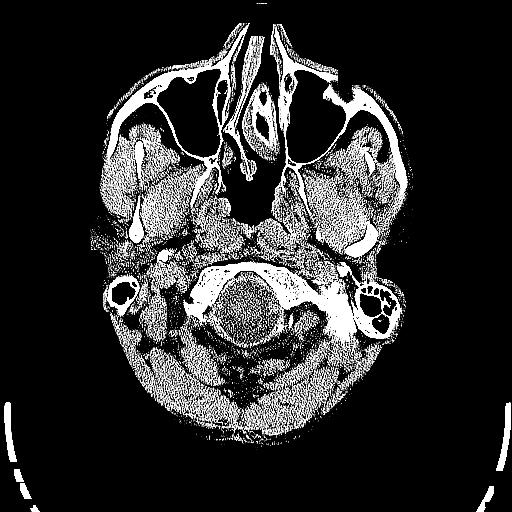
[im 4/78  bone]
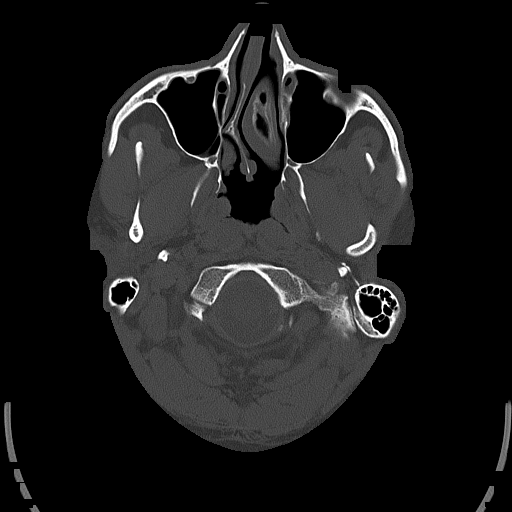
[im 8/78  brain]
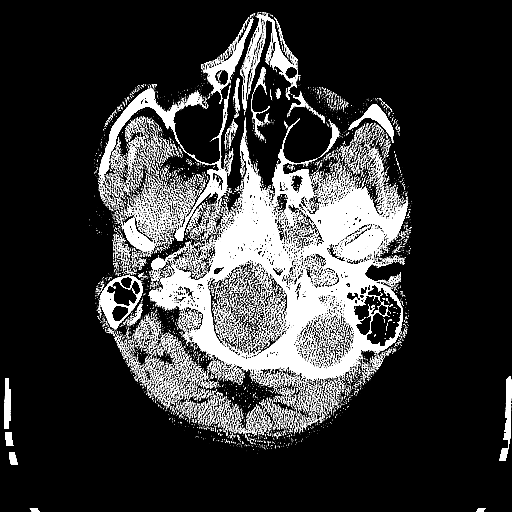
[im 12/78  brain]
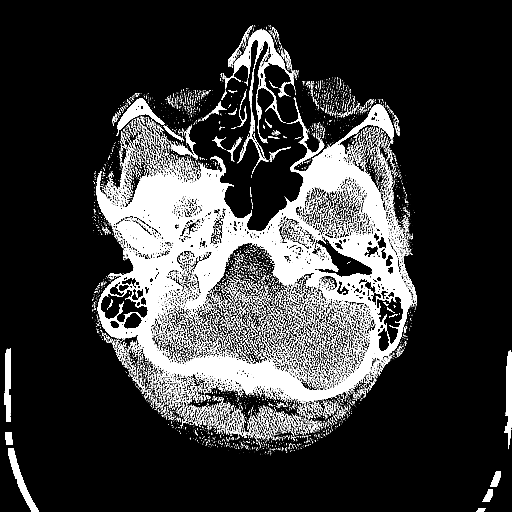
[im 20/78  brain]
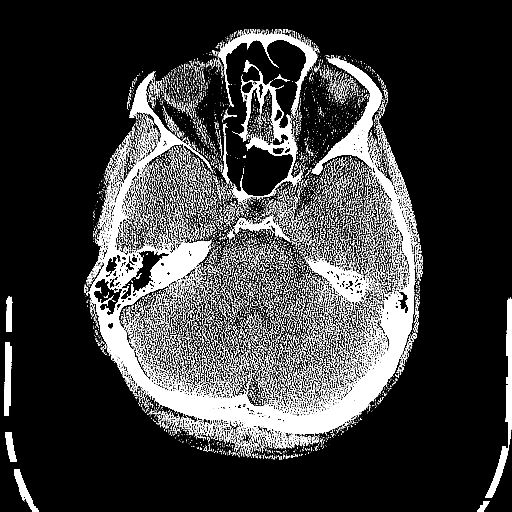
[im 24/78  brain]
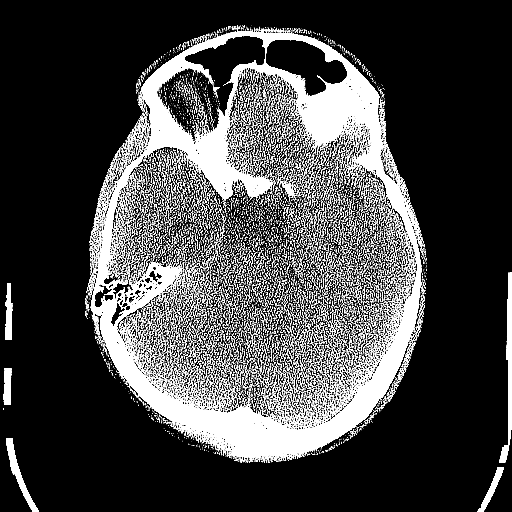
[im 24/78  bone]
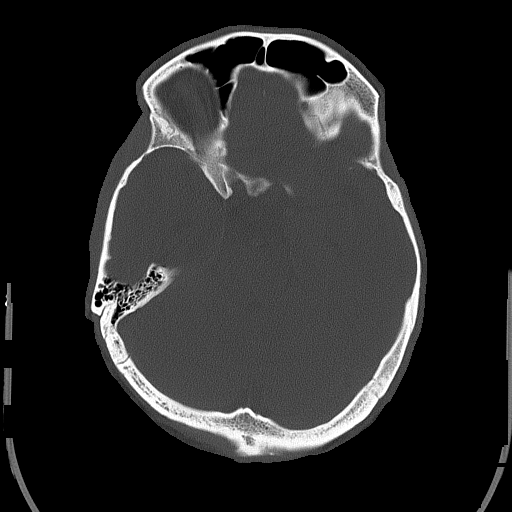
[im 27/78  brain]
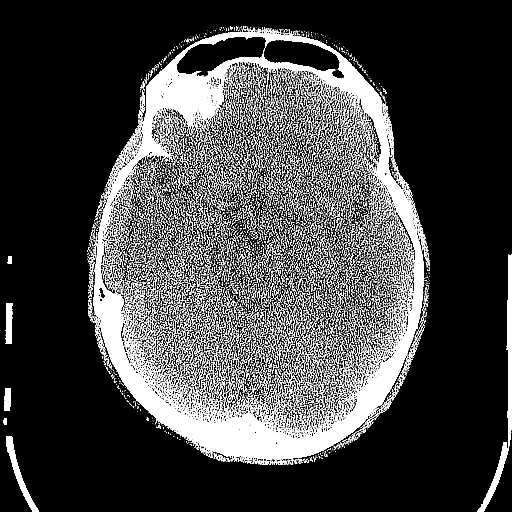
[im 31/78  brain]
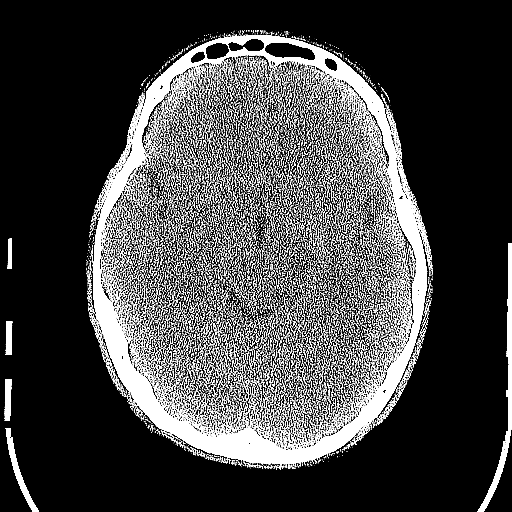
[im 35/78  brain]
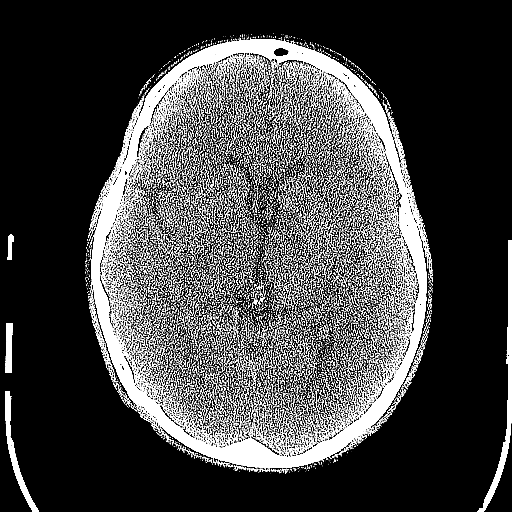
[im 43/78  brain]
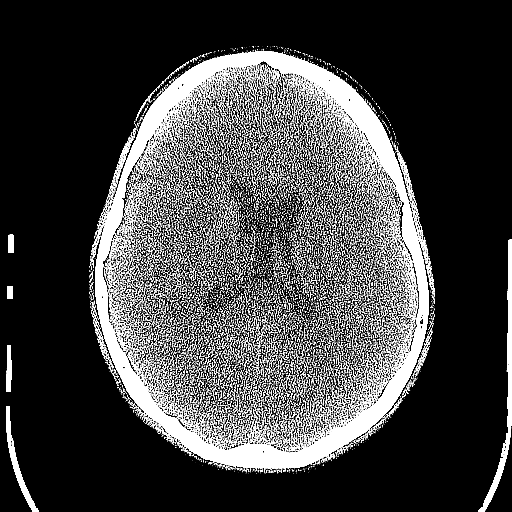
[im 43/78  bone]
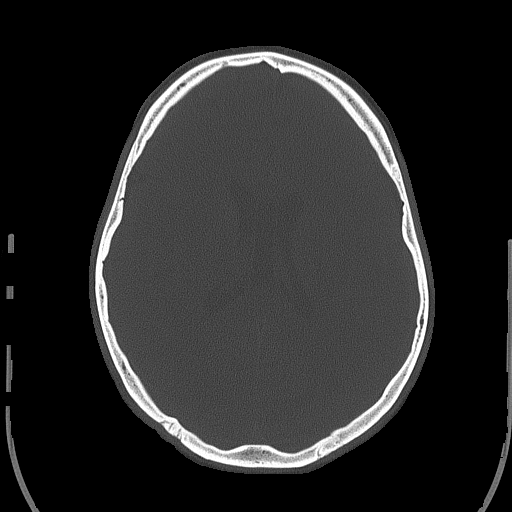
[im 47/78  brain]
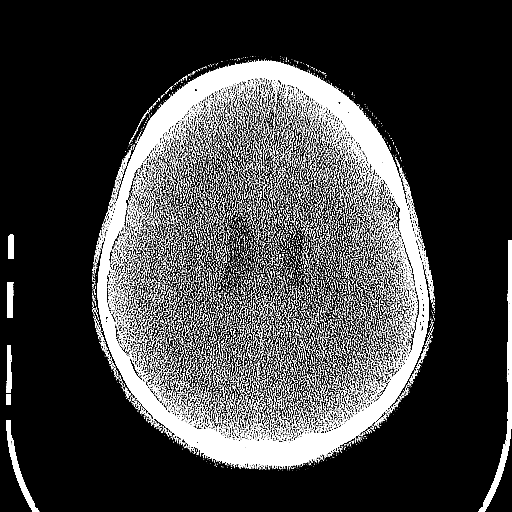
[im 51/78  brain]
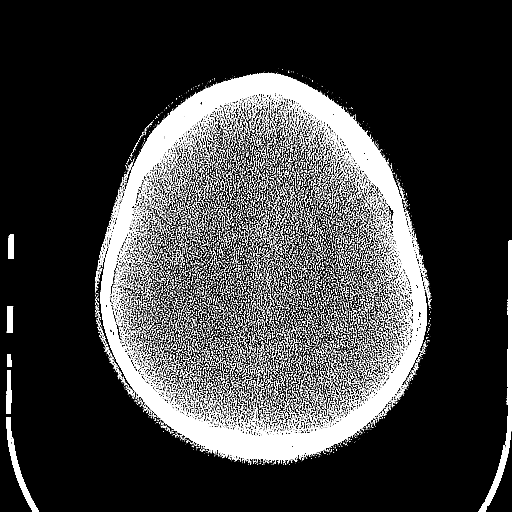
[im 54/78  brain]
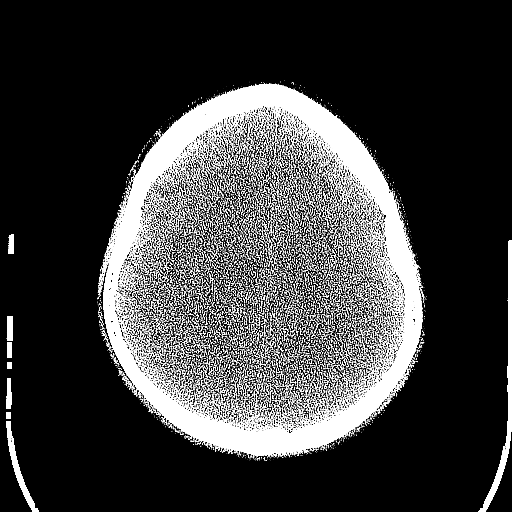
[im 58/78  brain]
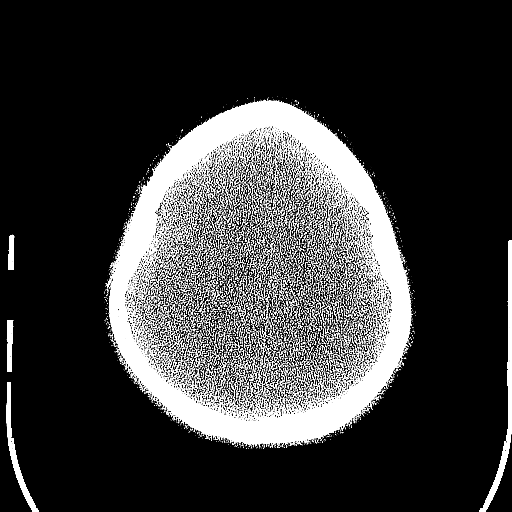
[im 58/78  bone]
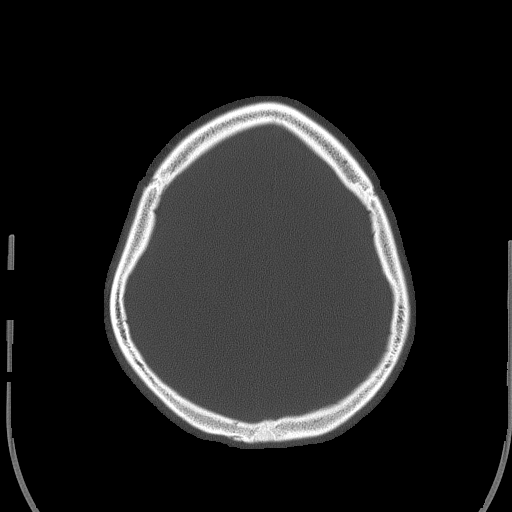
[im 66/78  brain]
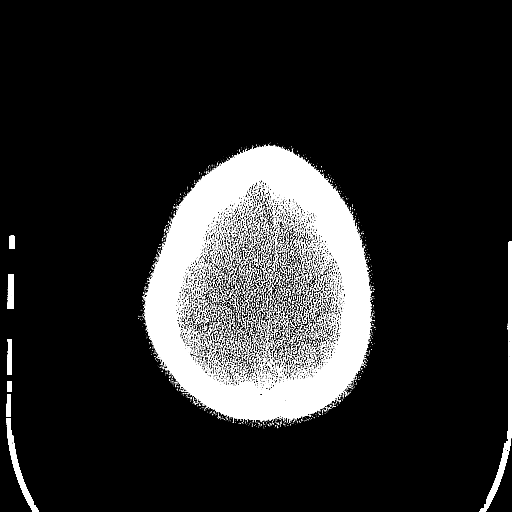
[im 70/78  brain]
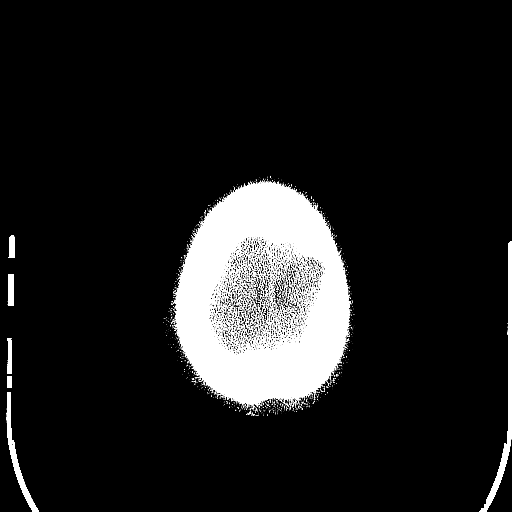
[im 74/78  brain]
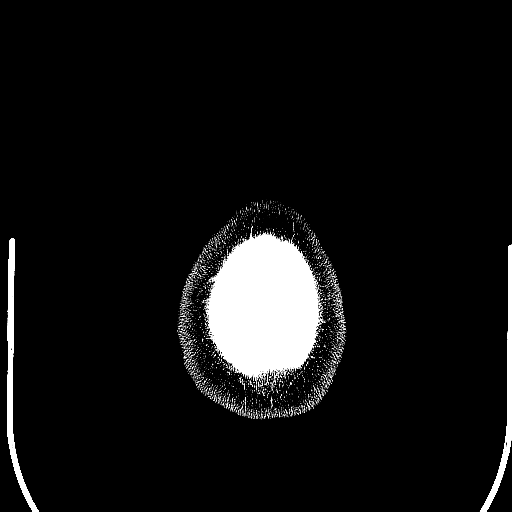

[16 of 30 positions shown; findings below may reference images not displayed]

FINDINGS: No evidence of an acute infarct, acute hemorrhage, mass lesion, mass
effect or hydrocephalus. Visualized portions of the paranasal
sinuses and mastoid air cells are clear.
IMPRESSION: Negative. These results were called by telephone at the time of
interpretation on 12/15/2013 at [DATE] to Dr. RENOVIL, who verbally
acknowledged these results.

## 2016-06-02 IMAGING — DX DG CHEST 2V
2 series · 2 of 2 positions shown · non-contrast
Comparison: None.

CLINICAL DATA: Persistent headache, TIA, no chest complaints

EXAM:
CHEST  2 VIEW

[chest pa]
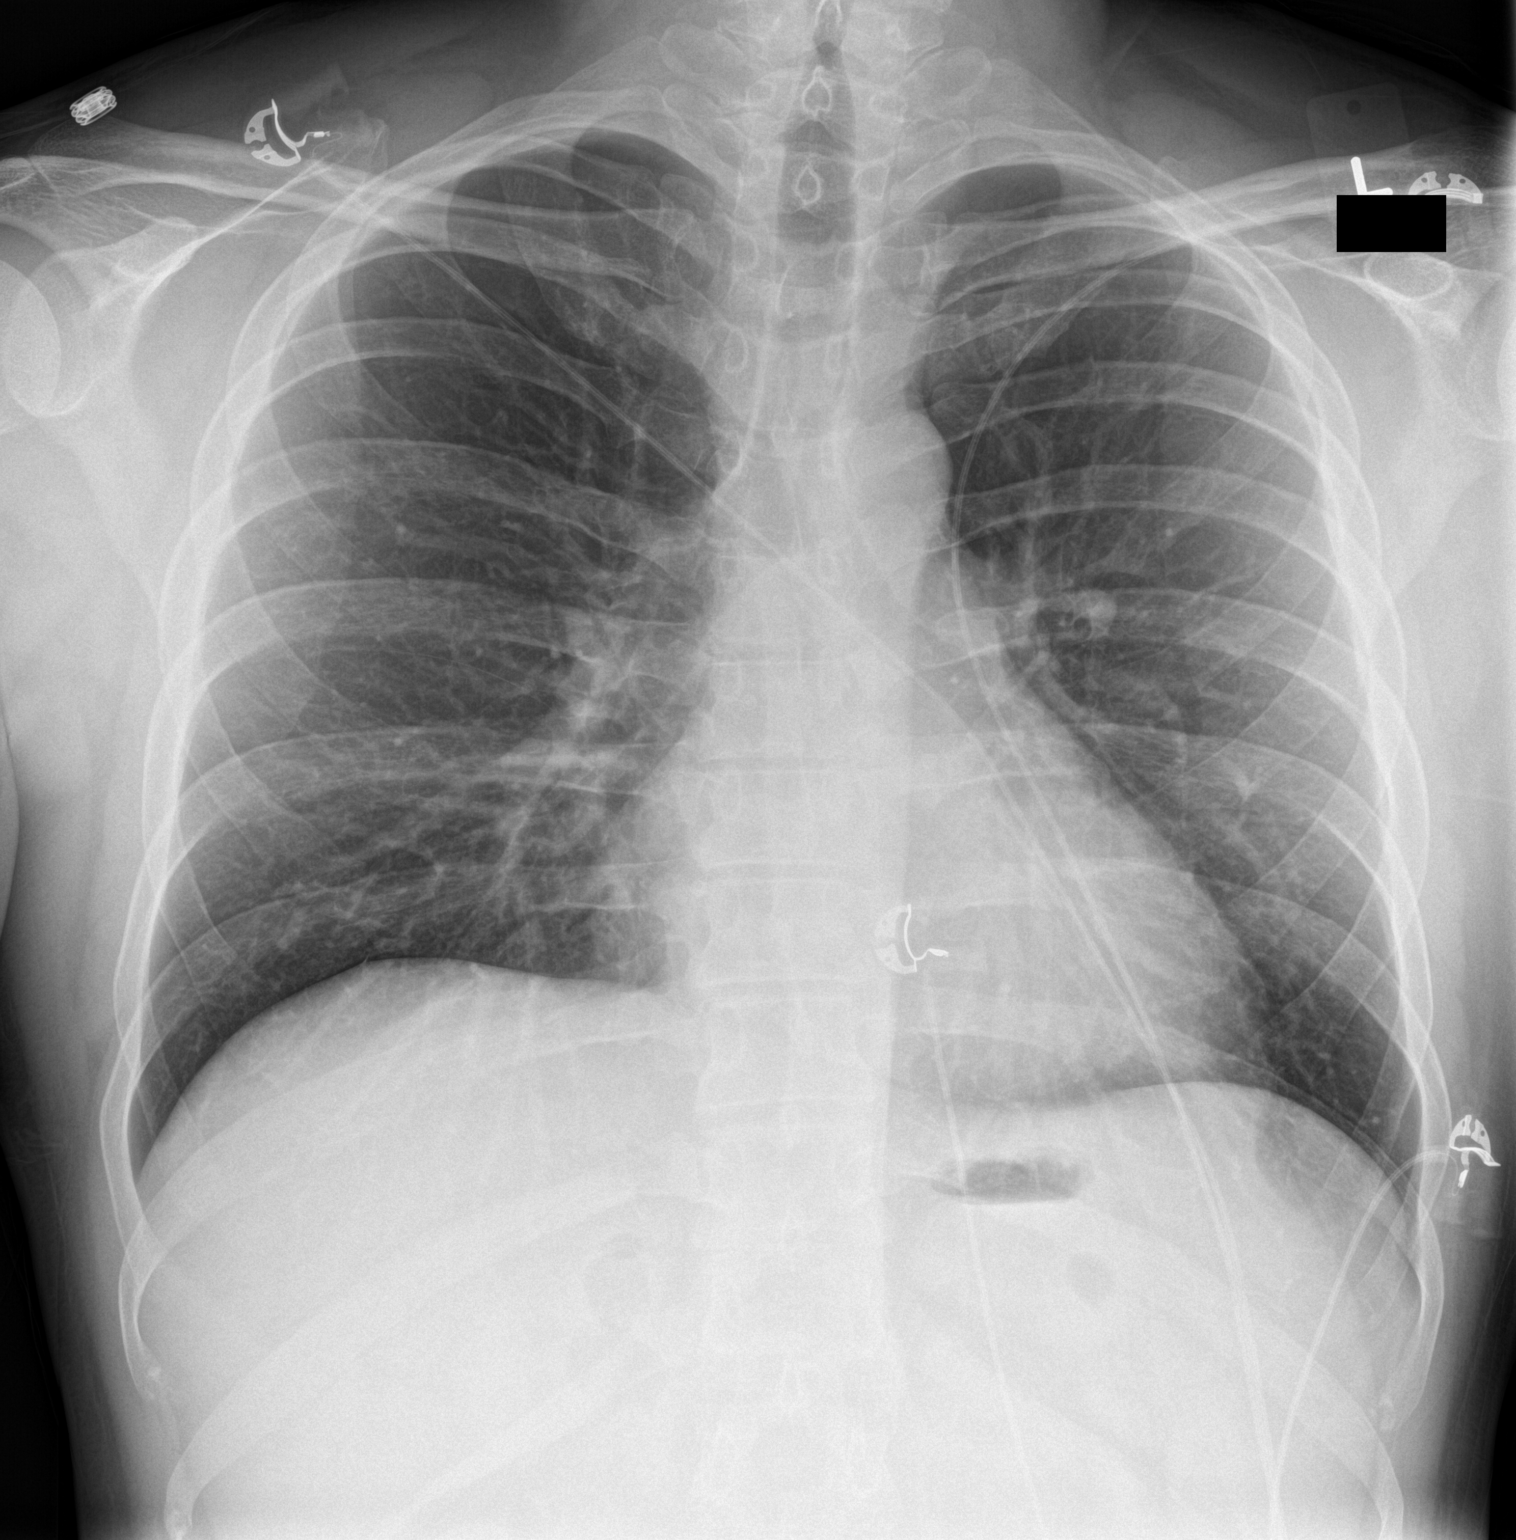

[chest lat]
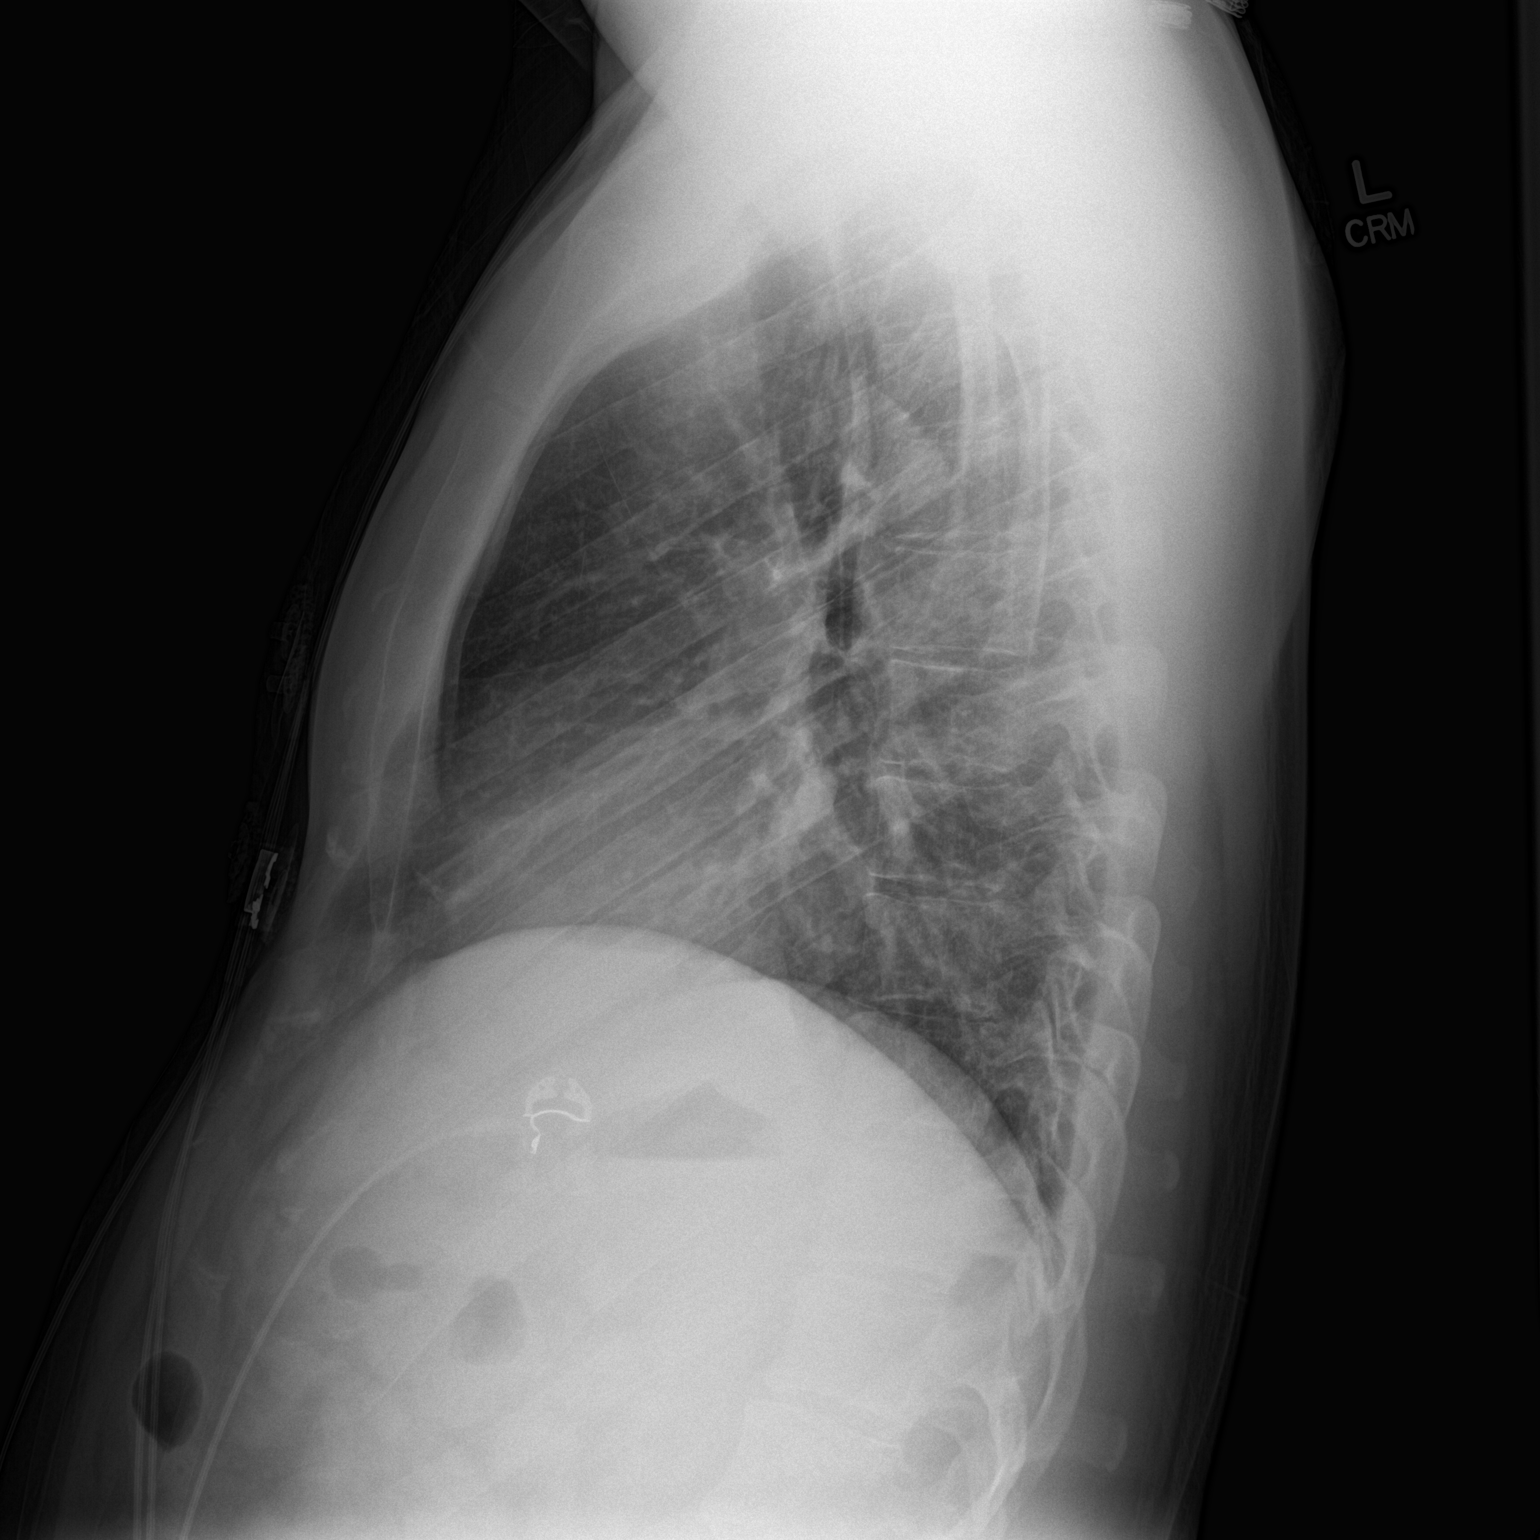

[2 of 2 positions shown; findings below may reference images not displayed]

FINDINGS: The heart size and mediastinal contours are within normal limits.
Both lungs are clear. The visualized skeletal structures are
unremarkable.
IMPRESSION: No active cardiopulmonary disease.
